# Patient Record
Sex: Female | Born: 1995 | Race: White | Hispanic: No | Marital: Single | Smoking: Never smoker
Health system: Southern US, Community
[De-identification: ages and names within clinical notes are randomized; demographics above are authoritative.]

## PROBLEM LIST (undated history)

## (undated) ENCOUNTER — Ambulatory Visit: Admission: EM | Payer: Self-pay | Source: Home / Self Care

## (undated) DIAGNOSIS — F329 Major depressive disorder, single episode, unspecified: Secondary | ICD-10-CM

## (undated) DIAGNOSIS — D649 Anemia, unspecified: Secondary | ICD-10-CM

## (undated) DIAGNOSIS — F32A Depression, unspecified: Secondary | ICD-10-CM

## (undated) HISTORY — DX: Depression, unspecified: F32.A

## (undated) HISTORY — DX: Anemia, unspecified: D64.9

## (undated) HISTORY — DX: Major depressive disorder, single episode, unspecified: F32.9

## (undated) HISTORY — PX: NO PAST SURGERIES: SHX2092

---
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
## 2015-02-22 ENCOUNTER — Emergency Department: Payer: Self-pay | Admitting: Emergency Medicine

## 2016-12-01 ENCOUNTER — Emergency Department
Admission: EM | Admit: 2016-12-01 | Discharge: 2016-12-01 | Disposition: A | Discharge status: Home / Self Care | Payer: Medicaid Other | Attending: Emergency Medicine | Admitting: Emergency Medicine

## 2016-12-01 ENCOUNTER — Encounter: Payer: Self-pay | Admitting: Emergency Medicine

## 2016-12-01 DIAGNOSIS — N898 Other specified noninflammatory disorders of vagina: Secondary | ICD-10-CM | POA: Insufficient documentation

## 2016-12-01 DIAGNOSIS — O200 Threatened abortion: Secondary | ICD-10-CM | POA: Diagnosis not present

## 2016-12-01 DIAGNOSIS — Z3A00 Weeks of gestation of pregnancy not specified: Secondary | ICD-10-CM | POA: Diagnosis not present

## 2016-12-01 DIAGNOSIS — N939 Abnormal uterine and vaginal bleeding, unspecified: Secondary | ICD-10-CM | POA: Diagnosis present

## 2016-12-01 LAB — RAPID HIV SCREEN (HIV 1/2 AB+AG)
HIV 1/2 Antibodies: NONREACTIVE
HIV-1 P24 Antigen - HIV24: NONREACTIVE

## 2016-12-01 LAB — WET PREP, GENITAL
Clue Cells Wet Prep HPF POC: NONE SEEN
Sperm: NONE SEEN
Trich, Wet Prep: NONE SEEN
Yeast Wet Prep HPF POC: NONE SEEN

## 2016-12-01 LAB — POCT PREGNANCY, URINE: PREG TEST UR: POSITIVE — AB

## 2016-12-01 LAB — CHLAMYDIA/NGC RT PCR (ARMC ONLY)
CHLAMYDIA TR: NOT DETECTED
N GONORRHOEAE: NOT DETECTED

## 2016-12-01 LAB — ABO/RH: ABO/RH(D): A POS

## 2016-12-01 LAB — HCG, QUANTITATIVE, PREGNANCY: hCG, Beta Chain, Quant, S: 48642 m[IU]/mL — ABNORMAL HIGH (ref <5)

## 2016-12-01 NOTE — ED Notes (Signed)
A/o, small amount bleeding, denies pain, cramping.

## 2016-12-01 NOTE — ED Provider Notes (Signed)
[facility] Emergency [facility] Provider Note    ____________________________________________   I have reviewed the triage vital signs and the nursing notes.   HISTORY  Chief Complaint Vaginal Bleeding and Abdominal Cramping   History limited by: Not Limited   HPI Meagan Butler is a 20 y.o. female who presents to the emergency department today because of concerns for vaginal bleeding. The patient states that she found out she was pregnant couple of weeks ago. Has not had any prenatal care since then. She does not recall having any period in November or October. She has had some nausea. The patient has not been taking prenatal vitamins.   History reviewed. No pertinent past medical history.  There are no active problems to display for this patient.   History reviewed. No pertinent surgical history.  Prior to Admission medications   Not on File    Allergies Patient has no known allergies.  No family history on file.  Social History Social History  Substance Use Topics  . Smoking status: Never Smoker  . Smokeless tobacco: Never Used  . Alcohol use No    Review of Systems  Constitutional: Negative for fever. Cardiovascular: Negative for chest pain. Respiratory: Negative for shortness of breath. Gastrointestinal: Negative for abdominal pain, vomiting and diarrhea. Genitourinary: Positive for vaginal bleeding. Musculoskeletal: Negative for back pain. Skin: Negative for rash. Neurological: Negative for headaches, focal weakness or numbness.  10-point ROS otherwise negative.  ____________________________________________   PHYSICAL EXAM:  VITAL SIGNS: ED Triage Vitals [12/01/16 1457]  Enc Vitals Group     BP 124/78     Pulse Rate 98     Resp 20     Temp 98.3 F (36.8 C)     Temp Source Oral     SpO2 100 %     Weight 78 lb (35.4 kg)    Constitutional: Alert and oriented. Well appearing and in no distress. Eyes: Conjunctivae  are normal. Normal extraocular movements. ENT   Head: Normocephalic and atraumatic.   Nose: No congestion/rhinnorhea.   Mouth/Throat: Mucous membranes are moist.   Neck: No stridor. Hematological/Lymphatic/Immunilogical: No cervical lymphadenopathy. Cardiovascular: Normal rate, regular rhythm.  No murmurs, rubs, or gallops.  Respiratory: Normal respiratory effort without tachypnea nor retractions. Breath sounds are clear and equal bilaterally. No wheezes/rales/rhonchi. Gastrointestinal: Soft and non tender. No rebound. No guarding.  Pelvic exam: No external lesions. No blood in vaginal vault. Small amount of discharge. No CMT. No adnexal fullness or tenderness.  Musculoskeletal: Normal range of motion in all extremities. No lower extremity edema. Neurologic:  Normal speech and language. No gross focal neurologic deficits are appreciated.  Skin:  Skin is warm, dry and intact. No rash noted. Psychiatric: Mood and affect are normal. Speech and behavior are normal. Patient exhibits appropriate insight and judgment.  ____________________________________________    LABS (pertinent positives/negatives)  Labs Reviewed  WET PREP, GENITAL - Abnormal; Notable for the following:       Result Value   WBC, Wet Prep HPF POC FEW (*)    All other components within normal limits  HCG, QUANTITATIVE, PREGNANCY - Abnormal; Notable for the following:    hCG, Beta Chain, Quant, [place] [id number] (*)    All other components within normal limits  POCT PREGNANCY, URINE - Abnormal; Notable for the following:    Preg Test, Ur POSITIVE (*)    All other components within normal limits  CHLAMYDIA/NGC RT PCR ([facility] ONLY)  RAPID HIV SCREEN (HIV 1/2 AB+AG)  POC  URINE PREG, ED  ABO/RH     ____________________________________________   EKG  None  ____________________________________________     RADIOLOGY  None  ____________________________________________   PROCEDURES  Procedures  ____________________________________________   INITIAL IMPRESSION / ASSESSMENT AND PLAN / ED COURSE a Pertinent labs & imaging results that were available during my care of the patient were reviewed by me and considered in my medical decision making (see chart for details).  Patient presented to the emergency department today because of concerns for vaginal spotting in the setting of an early pregnancy. Patient's Rh+. Pelvic exam without any concerning findings. Bedside ultrasound does show a single intrauterine pregnancy with heartbeat. Will have patient follow-up with OB/GYN.  ____________________________________________   FINAL CLINICAL IMPRESSION(S) / ED DIAGNOSES  Final diagnoses:  Threatened miscarriage     Note: This dictation was prepared with Dragon dictation. Any transcriptional errors that result from this process are unintentional     Phineas SemenGraydon Shona Pardo, MD 12/01/16 2034

## 2016-12-01 NOTE — Discharge Instructions (Signed)
Please start taking prenatal vitamins daily. Please seek medical attention for any high fevers, chest pain, shortness of breath, change in behavior, persistent vomiting, bloody stool or any other new or concerning symptoms.

## 2016-12-01 NOTE — ED Triage Notes (Signed)
Pt states she is pregnant and having spotting last night with some cramping. pts first pregnancy. Pt lmp back in October. Pregnancy confirmed by home preg test.

## 2016-12-11 ENCOUNTER — Ambulatory Visit (INDEPENDENT_AMBULATORY_CARE_PROVIDER_SITE_OTHER): Payer: Self-pay | Admitting: Certified Nurse Midwife

## 2016-12-11 VITALS — BP 111/78 | HR 105 | Ht 62.0 in | Wt 80.0 lb

## 2016-12-11 DIAGNOSIS — Z202 Contact with and (suspected) exposure to infections with a predominantly sexual mode of transmission: Secondary | ICD-10-CM

## 2016-12-11 DIAGNOSIS — O219 Vomiting of pregnancy, unspecified: Secondary | ICD-10-CM

## 2016-12-11 DIAGNOSIS — Z3687 Encounter for antenatal screening for uncertain dates: Secondary | ICD-10-CM

## 2016-12-11 DIAGNOSIS — Z3401 Encounter for supervision of normal first pregnancy, first trimester: Secondary | ICD-10-CM

## 2016-12-11 MED ORDER — CITRANATAL B-CALM 20-1 MG & 2 X 25 MG PO MISC: 25.0000 mg | Freq: Every day | ORAL | 3 refills | Status: DC

## 2016-12-11 NOTE — Progress Notes (Signed)
Meagan Kym GroomM Bargar presents for NOB nurse interview visit. Pregnancy confirmation done __[facility] on 12/18. Pos upt. Per ER notes bedside u/s showing SIUP with HRT tones.  Was seen for vag bleeding and cramps. NO bleeding since. Blood type-  A POS.  G-1 .  P0- . Uncertain lmp- 09/12/2016 EDD- 06/19/2017. 12 6/7.  Dating scan asap. Pt aware to see Marcelino DusterMichelle at 10-11 weeks depending on results of scan.  Pregnancy education material explained and given. __0_ cats in the home. NOB labs ordered.  Drug screen and HIV labs ordered. PNV encouraged. Pos n/v. Pt has taken b6 and unisom with relief. Advised to continue. If pt needs additional meds she is to contact office. Citra natal Bcalm erx.  Genetic screening to discuss with provider. Pt. To follow up with provider in _1_ weeks(depending on u/s results)  for NOB physical.  All questions answered.

## 2016-12-11 NOTE — Patient Instructions (Signed)
Minor Illnesses and Medications in Pregnancy  Cold/Flu:  Sudafed for congestion- Robitussin (plain) for cough- Tylenol for discomfort.  Please follow the directions on the label.  Try not to take any more than needed.  OTC Saline nasal spray and air humidifier or cool-mist  Vaporizer to sooth nasal irritation and to loosen congestion.  It is also important to increase intake of non carbonated fluids, especially if you have a fever.  Constipation:  Colace-2 capsules at bedtime; Metamucil- follow directions on label; Senokot- 1 tablet at bedtime.  Any one of these medications can be used.  It is also very important to increase fluids and fruits along with regular exercise.  If problem persists please call the office.  Diarrhea:  Kaopectate as directed on the label.  Eat a bland diet and increase fluids.  Avoid highly seasoned foods.  Headache:  Tylenol 1 or 2 tablets every 3-4 hours as needed  Indigestion:  Maalox, Mylanta, Tums or Rolaids- as directed on label.  Also try to eat small meals and avoid fatty, greasy or spicy foods.  Nausea with or without Vomiting:  Nausea in pregnancy is caused by increased levels of hormones in the body which influence the digestive system and cause irritation when stomach acids accumulate.  Symptoms usually subside after 1st trimester of pregnancy.  Try the following: 1. Keep saltines, graham crackers or dry toast by your bed to eat upon awakening. 2. Don't let your stomach get empty.  Try to eat 5-6 small meals per day instead of 3 large ones. 3. Avoid greasy fatty or highly seasoned foods.  4. Take OTC Unisom 1 tablet at bed time along with OTC Vitamin B6 25-50 mg 3 times per day.    If nausea continues with vomiting and you are unable to keep down food and fluids you may need a prescription medication.  Please notify your provider.   Sore throat:  Chloraseptic spray, throat lozenges and or plain Tylenol.  Vaginal Yeast Infection:  OTC Monistat for 7 days as  directed on label.  If symptoms do not resolve within a week notify provider.  If any of the above problems do not subside with recommended treatment please call the office for further assistance.   Do not take Aspirin, Advil, Motrin or Ibuprofen.  * * OTC= Over the counter How a Baby Grows During Pregnancy Introduction Pregnancy begins when a female's sperm enters a female's egg (fertilization). This happens in one of the tubes (fallopian tubes) that connect the ovaries to the womb (uterus). The fertilized egg is called an embryo until it reaches 10 weeks. From 10 weeks until birth, it is called a fetus. The fertilized egg moves down the fallopian tube to the uterus. Then it implants into the lining of the uterus and begins to grow. The developing fetus receives oxygen and nutrients through the pregnant woman's bloodstream and the tissues that grow (placenta) to support the fetus. The placenta is the life support system for the fetus. It provides nutrition and removes waste. Learning as much as you can about your pregnancy and how your baby is developing can help you enjoy the experience. It can also make you aware of when there might be a problem and when to ask questions. How long does a typical pregnancy last? A pregnancy usually lasts 280 days, or about 40 weeks. Pregnancy is divided into three trimesters:  First trimester: 0-13 weeks.  Second trimester: 14-27 weeks.  Third trimester: 28-40 weeks. The day when your baby   is considered ready to be born (full term) is your estimated date of delivery. How does my baby develop month by month? First month  The fertilized egg attaches to the inside of the uterus.  Some cells will form the placenta. Others will form the fetus.  The arms, legs, brain, spinal cord, lungs, and heart begin to develop.  At the end of the first month, the heart begins to beat. Second month  The bones, inner ear, eyelids, hands, and feet form.  The genitals  develop.  By the end of 8 weeks, all major organs are developing. Third month  All of the internal organs are forming.  Teeth develop below the gums.  Bones and muscles begin to grow. The spine can flex.  The skin is transparent.  Fingernails and toenails begin to form.  Arms and legs continue to grow longer, and hands and feet develop.  The fetus is about 3 in (7.6 cm) long. Fourth month  The placenta is completely formed.  The external sex organs, neck, outer ear, eyebrows, eyelids, and fingernails are formed.  The fetus can hear, swallow, and move its arms and legs.  The kidneys begin to produce urine.  The skin is covered with a white waxy coating (vernix) and very fine hair (lanugo). Fifth month  The fetus moves around more and can be felt for the first time (quickening).  The fetus starts to sleep and wake up and may begin to suck its finger.  The nails grow to the end of the fingers.  The organ in the digestive system that makes bile (gallbladder) functions and helps to digest the nutrients.  If your baby is a girl, eggs are present in her ovaries. If your baby is a boy, testicles start to move down into his scrotum. Sixth month  The lungs are formed, but the fetus is not yet able to breathe.  The eyes open. The brain continues to develop.  Your baby has fingerprints and toe prints. Your baby's hair grows thicker.  At the end of the second trimester, the fetus is about 9 in (22.9 cm) long. Seventh month  The fetus kicks and stretches.  The eyes are developed enough to sense changes in light.  The hands can make a grasping motion.  The fetus responds to sound. Eighth month  All organs and body systems are fully developed and functioning.  Bones harden and taste buds develop. The fetus may hiccup.  Certain areas of the brain are still developing. The skull remains soft. Ninth month  The fetus gains about  lb (0.23 kg) each week.  The lungs  are fully developed.  Patterns of sleep develop.  The fetus's head typically moves into a head-down position (vertex) in the uterus to prepare for birth. If the buttocks move into a vertex position instead, the baby is breech.  The fetus weighs 6-9 lbs (2.72-4.08 kg) and is 19-20 in (48.26-50.8 cm) long. What can I do to have a healthy pregnancy and help my baby develop?  Eating and Drinking  Eat a healthy diet.  Talk with your health care provider to make sure that you are getting the nutrients that you and your baby need.  Visit www.choosemyplate.gov to learn about creating a healthy diet.  Gain a healthy amount of weight during pregnancy as advised by your health care provider. This is usually 25-35 pounds. You may need to:  Gain more if you were underweight before getting pregnant or if you are pregnant   with more than one baby.  Gain less if you were overweight or obese when you got pregnant. Medicines and Vitamins  Take prenatal vitamins as directed by your health care provider. These include vitamins such as folic acid, iron, calcium, and vitamin D. They are important for healthy development.  Take medicines only as directed by your health care provider. Read labels and ask a pharmacist or your health care provider whether over-the-counter medicines, supplements, and prescription drugs are safe to take during pregnancy. Activities  Be physically active as advised by your health care provider. Ask your health care provider to recommend activities that are safe for you to do, such as walking or swimming.  Do not participate in strenuous or extreme sports.  Lifestyle  Do not drink alcohol.  Do not use any tobacco products, including cigarettes, chewing tobacco, or electronic cigarettes. If you need help quitting, ask your health care provider.  Do not use illegal drugs. Safety  Avoid exposure to mercury, lead, or other heavy metals. Ask your health care provider about  common sources of these heavy metals.  Avoid listeria infection during pregnancy. Follow these precautions:  Do not eat soft cheeses or deli meats.  Do not eat hot dogs unless they have been warmed up to the point of steaming, such as in the microwave oven.  Do not drink unpasteurized milk.  Avoid toxoplasmosis infection during pregnancy. Follow these precautions:  Do not change your cat's litter box, if you have a cat. Ask someone else to do this for you.  Wear gardening gloves while working in the yard. General Instructions  Keep all follow-up visits as directed by your health care provider. This is important. This includes prenatal care and screening tests.  Manage any chronic health conditions. Work closely with your health care provider to keep conditions, such as diabetes, under control. How do I know if my baby is developing well? At each prenatal visit, your health care provider will do several different tests to check on your health and keep track of your baby's development. These include:  Fundal height.  Your health care provider will measure your growing belly from top to bottom using a tape measure.  Your health care provider will also feel your belly to determine your baby's position.  Heartbeat.  An ultrasound in the first trimester can confirm pregnancy and show a heartbeat, depending on how far along you are.  Your health care provider will check your baby's heart rate at every prenatal visit.  As you get closer to your delivery date, you may have regular fetal heart rate monitoring to make sure that your baby is not in distress.  Second trimester ultrasound.  This ultrasound checks your baby's development. It also indicates your baby's gender. What should I do if I have concerns about my baby's development? Always talk with your health care provider about any concerns that you may have. This information is not intended to replace advice given to you by your  health care provider. Make sure you discuss any questions you have with your health care provider. Document Released: 05/19/2008 Document Revised: 05/08/2016 Document Reviewed: 05/10/2014  2017 [employer] First Trimester of Pregnancy The first trimester of pregnancy is from week 1 until the end of week 12 (months 1 through 3). A week after a sperm fertilizes an egg, the egg will implant on the wall of the uterus. This embryo will begin to develop into a baby. Genes from you and your partner are forming the   baby. The female genes determine whether the baby is a boy or a girl. At 6-8 weeks, the eyes and face are formed, and the heartbeat can be seen on ultrasound. At the end of 12 weeks, all the baby's organs are formed.  Now that you are pregnant, you will want to do everything you can to have a healthy baby. Two of the most important things are to get good prenatal care and to follow your health care provider's instructions. Prenatal care is all the medical care you receive before the baby's birth. This care will help prevent, find, and treat any problems during the pregnancy and childbirth. BODY CHANGES Your body goes through many changes during pregnancy. The changes vary from woman to woman.   You may gain or lose a couple of pounds at first.  You may feel sick to your stomach (nauseous) and throw up (vomit). If the vomiting is uncontrollable, call your health care provider.  You may tire easily.  You may develop headaches that can be relieved by medicines approved by your health care provider.  You may urinate more often. Painful urination may mean you have a bladder infection.  You may develop heartburn as a result of your pregnancy.  You may develop constipation because certain hormones are causing the muscles that push waste through your intestines to slow down.  You may develop hemorrhoids or swollen, bulging veins (varicose veins).  Your breasts may begin to grow larger and become  tender. Your nipples may stick out more, and the tissue that surrounds them (areola) may become darker.  Your gums may bleed and may be sensitive to brushing and flossing.  Dark spots or blotches (chloasma, mask of pregnancy) may develop on your face. This will likely fade after the baby is born.  Your menstrual periods will stop.  You may have a loss of appetite.  You may develop cravings for certain kinds of food.  You may have changes in your emotions from day to day, such as being excited to be pregnant or being concerned that something may go wrong with the pregnancy and baby.  You may have more vivid and strange dreams.  You may have changes in your hair. These can include thickening of your hair, rapid growth, and changes in texture. Some women also have hair loss during or after pregnancy, or hair that feels dry or thin. Your hair will most likely return to normal after your baby is born. WHAT TO EXPECT AT YOUR PRENATAL VISITS During a routine prenatal visit:  You will be weighed to make sure you and the baby are growing normally.  Your blood pressure will be taken.  Your abdomen will be measured to track your baby's growth.  The fetal heartbeat will be listened to starting around week 10 or 12 of your pregnancy.  Test results from any previous visits will be discussed. Your health care provider may ask you:  How you are feeling.  If you are feeling the baby move.  If you have had any abnormal symptoms, such as leaking fluid, bleeding, severe headaches, or abdominal cramping.  If you are using any tobacco products, including cigarettes, chewing tobacco, and electronic cigarettes.  If you have any questions. Other tests that may be performed during your first trimester include:  Blood tests to find your blood type and to check for the presence of any previous infections. They will also be used to check for low iron levels (anemia) and Rh antibodies. Later in   the  pregnancy, blood tests for diabetes will be done along with other tests if problems develop.  Urine tests to check for infections, diabetes, or protein in the urine.  An ultrasound to confirm the proper growth and development of the baby.  An amniocentesis to check for possible genetic problems.  Fetal screens for spina bifida and Down syndrome.  You may need other tests to make sure you and the baby are doing well.  HIV (human immunodeficiency virus) testing. Routine prenatal testing includes screening for HIV, unless you choose not to have this test. HOME CARE INSTRUCTIONS  Medicines   Follow your health care provider's instructions regarding medicine use. Specific medicines may be either safe or unsafe to take during pregnancy.  Take your prenatal vitamins as directed.  If you develop constipation, try taking a stool softener if your health care provider approves. Diet   Eat regular, well-balanced meals. Choose a variety of foods, such as meat or vegetable-based protein, fish, milk and low-fat dairy products, vegetables, fruits, and whole grain breads and cereals. Your health care provider will help you determine the amount of weight gain that is right for you.  Avoid raw meat and uncooked cheese. These carry germs that can cause birth defects in the baby.  Eating four or five small meals rather than three large meals a day may help relieve nausea and vomiting. If you start to feel nauseous, eating a few soda crackers can be helpful. Drinking liquids between meals instead of during meals also seems to help nausea and vomiting.  If you develop constipation, eat more high-fiber foods, such as fresh vegetables or fruit and whole grains. Drink enough fluids to keep your urine clear or pale yellow. Activity and Exercise   Exercise only as directed by your health care provider. Exercising will help you:  Control your weight.  Stay in shape.  Be prepared for labor and  delivery.  Experiencing pain or cramping in the lower abdomen or low back is a good sign that you should stop exercising. Check with your health care provider before continuing normal exercises.  Try to avoid standing for long periods of time. Move your legs often if you must stand in one place for a long time.  Avoid heavy lifting.  Wear low-heeled shoes, and practice good posture.  You may continue to have sex unless your health care provider directs you otherwise. Relief of Pain or Discomfort   Wear a good support bra for breast tenderness.   Take warm sitz baths to soothe any pain or discomfort caused by hemorrhoids. Use hemorrhoid cream if your health care provider approves.   Rest with your legs elevated if you have leg cramps or low back pain.  If you develop varicose veins in your legs, wear support hose. Elevate your feet for 15 minutes, 3-4 times a day. Limit salt in your diet. Prenatal Care   Schedule your prenatal visits by the twelfth week of pregnancy. They are usually scheduled monthly at first, then more often in the last 2 months before delivery.  Write down your questions. Take them to your prenatal visits.  Keep all your prenatal visits as directed by your health care provider. Safety   Wear your seat belt at all times when driving.  Make a list of emergency phone numbers, including numbers for family, friends, the hospital, and police and fire departments. General Tips   Ask your health care provider for a referral to a local prenatal education class. Begin   classes no later than at the beginning of month 6 of your pregnancy.  Ask for help if you have counseling or nutritional needs during pregnancy. Your health care provider can offer advice or refer you to specialists for help with various needs.  Do not use hot tubs, steam rooms, or saunas.  Do not douche or use tampons or scented sanitary pads.  Do not cross your legs for long periods of  time.  Avoid cat litter boxes and soil used by cats. These carry germs that can cause birth defects in the baby and possibly loss of the fetus by miscarriage or stillbirth.  Avoid all smoking, herbs, alcohol, and medicines not prescribed by your health care provider. Chemicals in these affect the formation and growth of the baby.  Do not use any tobacco products, including cigarettes, chewing tobacco, and electronic cigarettes. If you need help quitting, ask your health care provider. You may receive counseling support and other resources to help you quit.  Schedule a dentist appointment. At home, brush your teeth with a soft toothbrush and be gentle when you floss. SEEK MEDICAL CARE IF:   You have dizziness.  You have mild pelvic cramps, pelvic pressure, or nagging pain in the abdominal area.  You have persistent nausea, vomiting, or diarrhea.  You have a bad smelling vaginal discharge.  You have pain with urination.  You notice increased swelling in your face, hands, legs, or ankles. SEEK IMMEDIATE MEDICAL CARE IF:   You have a fever.  You are leaking fluid from your vagina.  You have spotting or bleeding from your vagina.  You have severe abdominal cramping or pain.  You have rapid weight gain or loss.  You vomit blood or material that looks like coffee grounds.  You are exposed to German measles and have never had them.  You are exposed to fifth disease or chickenpox.  You develop a severe headache.  You have shortness of breath.  You have any kind of trauma, such as from a fall or a car accident. This information is not intended to replace advice given to you by your health care provider. Make sure you discuss any questions you have with your health care provider. Document Released: 11/25/2001 Document Revised: 12/22/2014 Document Reviewed: 10/11/2013 Elsevier Interactive Patient Education  2017 [employer] Commonly Asked Questions During Pregnancy  Cats: A  parasite can be excreted in cat feces.  To avoid exposure you need to have another person empty the little box.  If you must empty the litter box you will need to wear gloves.  Wash your hands after handling your cat.  This parasite can also be found in raw or undercooked meat so this should also be avoided.  Colds, Sore Throats, Flu: Please check your medication sheet to see what you can take for symptoms.  If your symptoms are unrelieved by these medications please call the office.  Dental Work: Most any dental work your dentist recommends is permitted.  X-rays should only be taken during the first trimester if absolutely necessary.  Your abdomen should be shielded with a lead apron during all x-rays.  Please notify your provider prior to receiving any x-rays.  Novocaine is fine; gas is not recommended.  If your dentist requires a note from us prior to dental work please call the office and we will provide one for you.  Exercise: Exercise is an important part of staying healthy during your pregnancy.  You may continue most exercises you were accustomed   to prior to pregnancy.  Later in your pregnancy you will most likely notice you have difficulty with activities requiring balance like riding a bicycle.  It is important that you listen to your body and avoid activities that put you at a higher risk of falling.  Adequate rest and staying well hydrated are a must!  If you have questions about the safety of specific activities ask your provider.    Exposure to Children with illness: Try to avoid obvious exposure; report any symptoms to us when noted,  If you have chicken pos, red measles or mumps, you should be immune to these diseases.   Please do not take any vaccines while pregnant unless you have checked with your OB provider.  Fetal Movement: After 28 weeks we recommend you do "kick counts" twice daily.  Lie or sit down in a calm quiet environment and count your baby movements "kicks".  You should feel  your baby at least 10 times per hour.  If you have not felt 10 kicks within the first hour get up, walk around and have something sweet to eat or drink then repeat for an additional hour.  If count remains less than 10 per hour notify your provider.  Fumigating: Follow your pest control agent's advice as to how long to stay out of your home.  Ventilate the area well before re-entering.  Hemorrhoids:   Most over-the-counter preparations can be used during pregnancy.  Check your medication to see what is safe to use.  It is important to use a stool softener or fiber in your diet and to drink lots of liquids.  If hemorrhoids seem to be getting worse please call the office.   Hot Tubs:  Hot tubs Jacuzzis and saunas are not recommended while pregnant.  These increase your internal body temperature and should be avoided.  Intercourse:  Sexual intercourse is safe during pregnancy as long as you are comfortable, unless otherwise advised by your provider.  Spotting may occur after intercourse; report any bright red bleeding that is heavier than spotting.  Labor:  If you know that you are in labor, please go to the hospital.  If you are unsure, please call the office and let us help you decide what to do.  Lifting, straining, etc:  If your job requires heavy lifting or straining please check with your provider for any limitations.  Generally, you should not lift items heavier than that you can lift simply with your hands and arms (no back muscles)  Painting:  Paint fumes do not harm your pregnancy, but may make you ill and should be avoided if possible.  Latex or water based paints have less odor than oils.  Use adequate ventilation while painting.  Permanents & Hair Color:  Chemicals in hair dyes are not recommended as they cause increase hair dryness which can increase hair loss during pregnancy.  " Highlighting" and permanents are allowed.  Dye may be absorbed differently and permanents may not hold as well  during pregnancy.  Sunbathing:  Use a sunscreen, as skin burns easily during pregnancy.  Drink plenty of fluids; avoid over heating.  Tanning Beds:  Because their possible side effects are still unknown, tanning beds are not recommended.  Ultrasound Scans:  Routine ultrasounds are performed at approximately 20 weeks.  You will be able to see your baby's general anatomy an if you would like to know the gender this can usually be determined as well.  If it is questionable when   you conceived you may also receive an ultrasound early in your pregnancy for dating purposes.  Otherwise ultrasound exams are not routinely performed unless there is a medical necessity.  Although you can request a scan we ask that you pay for it when conducted because insurance does not cover " patient request" scans.  Work: If your pregnancy proceeds without complications you may work until your due date, unless your physician or employer advises otherwise.  Round Ligament Pain/Pelvic Discomfort:  Sharp, shooting pains not associated with bleeding are fairly common, usually occurring in the second trimester of pregnancy.  They tend to be worse when standing up or when you remain standing for long periods of time.  These are the result of pressure of certain pelvic ligaments called "round ligaments".  Rest, Tylenol and heat seem to be the most effective relief.  As the womb and fetus grow, they rise out of the pelvis and the discomfort improves.  Please notify the office if your pain seems different than that described.  It may represent a more serious condition.   

## 2016-12-12 ENCOUNTER — Ambulatory Visit (INDEPENDENT_AMBULATORY_CARE_PROVIDER_SITE_OTHER): Payer: Self-pay

## 2016-12-12 DIAGNOSIS — Z3401 Encounter for supervision of normal first pregnancy, first trimester: Secondary | ICD-10-CM

## 2016-12-12 DIAGNOSIS — Z3687 Encounter for antenatal screening for uncertain dates: Secondary | ICD-10-CM

## 2016-12-12 LAB — CBC WITH DIFFERENTIAL/PLATELET
BASOS: 0 %
Basophils Absolute: 0 10*3/uL (ref 0.0–0.2)
EOS (ABSOLUTE): 0 10*3/uL (ref 0.0–0.4)
EOS: 0 %
HEMATOCRIT: 35.6 % (ref 34.0–46.6)
Hemoglobin: 12.2 g/dL (ref 11.1–15.9)
IMMATURE GRANS (ABS): 0 10*3/uL (ref 0.0–0.1)
IMMATURE GRANULOCYTES: 0 %
Lymphocytes Absolute: 1.3 10*3/uL (ref 0.7–3.1)
Lymphs: 16 %
MCH: 30.3 pg (ref 26.6–33.0)
MCHC: 34.3 g/dL (ref 31.5–35.7)
MCV: 88 fL (ref 79–97)
MONOS ABS: 1 10*3/uL — AB (ref 0.1–0.9)
Monocytes: 13 %
NEUTROS ABS: 5.7 10*3/uL (ref 1.4–7.0)
NEUTROS PCT: 71 %
Platelets: 296 10*3/uL (ref 150–379)
RBC: 4.03 x10E6/uL (ref 3.77–5.28)
RDW: 14.7 % (ref 12.3–15.4)
WBC: 8.2 10*3/uL (ref 3.4–10.8)

## 2016-12-12 LAB — RUBELLA SCREEN: Rubella Antibodies, IGG: 2.48 index (ref >0.99)

## 2016-12-12 LAB — ABO AND RH: RH TYPE: POSITIVE

## 2016-12-12 LAB — VARICELLA ZOSTER ANTIBODY, IGG: VARICELLA: 312 {index} (ref >165)

## 2016-12-12 LAB — HIV ANTIBODY (ROUTINE TESTING W REFLEX): HIV Screen 4th Generation wRfx: NONREACTIVE

## 2016-12-12 LAB — ANTIBODY SCREEN: Antibody Screen: NEGATIVE

## 2016-12-12 LAB — RPR: RPR Ser Ql: NONREACTIVE

## 2016-12-12 LAB — HEPATITIS B SURFACE ANTIGEN: Hepatitis B Surface Ag: NEGATIVE

## 2016-12-13 LAB — GC/CHLAMYDIA PROBE AMP
CHLAMYDIA, DNA PROBE: NEGATIVE
NEISSERIA GONORRHOEAE BY PCR: NEGATIVE

## 2016-12-13 LAB — CULTURE, OB URINE

## 2016-12-13 LAB — URINE CULTURE, OB REFLEX

## 2016-12-15 NOTE — L&D Delivery Note (Signed)
Delivery Note At  1923 a viable and healthy Female "Meagan Butler" was delivered via  (Presentation:OA ;  ).  APGAR:8 ,9  .   Placenta status: delivered intact with 3 vessel Cord:  with the following complications: none  Anesthesia:  episural Episiotomy:  none Lacerations:  2nd Suture Repair: 2.0 vicryl rapide Est. Blood Loss (mL):  150  Mom to postpartum.  Baby to Couplet care / Skin to Skin.  Devyon Keator N Kie Calvin 07/30/2017, 7:43 PM

## 2016-12-16 ENCOUNTER — Encounter: Payer: Medicaid Other | Admitting: Certified Nurse Midwife

## 2016-12-16 ENCOUNTER — Emergency Department
Admission: EM | Admit: 2016-12-16 | Discharge: 2016-12-16 | Disposition: A | Discharge status: Home / Self Care | Payer: Medicaid Other | Attending: Emergency Medicine | Admitting: Emergency Medicine

## 2016-12-16 ENCOUNTER — Encounter: Payer: Self-pay | Admitting: Medical Oncology

## 2016-12-16 DIAGNOSIS — O9989 Other specified diseases and conditions complicating pregnancy, childbirth and the puerperium: Secondary | ICD-10-CM | POA: Diagnosis not present

## 2016-12-16 DIAGNOSIS — O26891 Other specified pregnancy related conditions, first trimester: Secondary | ICD-10-CM | POA: Diagnosis present

## 2016-12-16 DIAGNOSIS — M436 Torticollis: Secondary | ICD-10-CM | POA: Insufficient documentation

## 2016-12-16 DIAGNOSIS — Z79899 Other long term (current) drug therapy: Secondary | ICD-10-CM | POA: Insufficient documentation

## 2016-12-16 DIAGNOSIS — Z3A08 8 weeks gestation of pregnancy: Secondary | ICD-10-CM | POA: Diagnosis not present

## 2016-12-16 LAB — MONITOR DRUG PROFILE 14(MW)
AMPHETAMINE SCREEN URINE: NEGATIVE ng/mL
BARBITURATE SCREEN URINE: NEGATIVE ng/mL
BENZODIAZEPINE SCREEN, URINE: NEGATIVE ng/mL
BUPRENORPHINE, URINE: NEGATIVE ng/mL
Cocaine (Metab) Scrn, Ur: NEGATIVE ng/mL
Creatinine(Crt), U: 124.4 mg/dL (ref 20.0–300.0)
FENTANYL, URINE: NEGATIVE pg/mL
Meperidine Screen, Urine: NEGATIVE ng/mL
Methadone Screen, Urine: NEGATIVE ng/mL
OPIATE SCREEN URINE: NEGATIVE ng/mL
OXYCODONE+OXYMORPHONE UR QL SCN: NEGATIVE ng/mL
PHENCYCLIDINE QUANTITATIVE URINE: NEGATIVE ng/mL
PROPOXYPHENE SCREEN URINE: NEGATIVE ng/mL
Ph of Urine: 5.5 (ref 4.5–8.9)
SPECIFIC GRAVITY: 1.019
TRAMADOL SCREEN, URINE: NEGATIVE ng/mL

## 2016-12-16 LAB — URINALYSIS, ROUTINE W REFLEX MICROSCOPIC
Bilirubin, UA: NEGATIVE
Glucose, UA: NEGATIVE
KETONES UA: NEGATIVE
LEUKOCYTES UA: NEGATIVE
NITRITE UA: NEGATIVE
Protein, UA: NEGATIVE
RBC UA: NEGATIVE
SPEC GRAV UA: 1.02 (ref 1.005–1.030)
UUROB: 0.2 mg/dL (ref 0.2–1.0)
pH, UA: 5.5 (ref 5.0–7.5)

## 2016-12-16 LAB — NICOTINE SCREEN, URINE: Cotinine Ql Scrn, Ur: NEGATIVE ng/mL

## 2016-12-16 LAB — CANNABINOID (GC/MS), URINE
CARBOXY THC UR: 26 ng/mL
Cannabinoid: POSITIVE — AB

## 2016-12-16 MED ORDER — OXYCODONE-ACETAMINOPHEN 5-325 MG PO TABS
1.0000 | ORAL_TABLET | Freq: Once | ORAL | Status: AC
  Administered 2016-12-16: 1 via ORAL
  Filled 2016-12-16: qty 1

## 2016-12-16 MED ORDER — OXYCODONE-ACETAMINOPHEN 5-325 MG PO TABS: 1.0000 | ORAL_TABLET | Freq: Four times a day (QID) | ORAL | 0 refills | Status: DC | PRN

## 2016-12-16 NOTE — ED Provider Notes (Signed)
[facility] Emergency Department Provider Note   ____________________________________________   First MD Initiated Contact with Patient 12/16/16 1303     (approximate)  I have reviewed the triage vital signs and the nursing notes.   HISTORY  Chief Complaint Neck Pain   HPI Meagan Butler is a 21 y.o. female Patient awakened this morning with right-sided neck pain. Patient state pain increases with any movement of the neck. Patient denies any radicular component to this pain patient denies loss sensation or function to the upper extremities. Patient is [redacted] weeks gestation and had a first OB appointment today. No palliative measures taken for her complaint.    Past Medical History:  Diagnosis Date  . Anemia   . Depression     There are no active problems to display for this patient.   Past Surgical History:  Procedure Laterality Date  . NO PAST SURGERIES      Prior to Admission medications   Medication Sig Start Date End Date Taking? Authorizing Provider  oxyCODONE-acetaminophen (ROXICET) 5-325 MG tablet Take 1 tablet by mouth every 6 (six) hours as needed for moderate pain. 12/16/16   Joni Reiningonald K Arvine Clayburn, PA-C  Prenat w/o A FeCbnFeGlu-FA &B6 (CITRANATAL B-CALM) 20-1 & 25 (2) MG MISC Take 25 mg by mouth daily. 12/11/16   Gunnar BullaJenkins Michelle Lawhorn, CNM  Prenatal Vit-Fe Fumarate-FA (MULTIVITAMIN-PRENATAL) 27-0.8 MG TABS tablet Take 1 tablet by mouth daily at 12 noon.    Historical Provider, MD    Allergies Patient has no known allergies.  Family History  Problem Relation Age of Onset  . Adopted: Yes    Social History Social History  Substance Use Topics  . Smoking status: Never Smoker  . Smokeless tobacco: Never Used  . Alcohol use No    Review of Systems Constitutional: No fever/chills Eyes: No visual changes. ENT: No sore throat. Cardiovascular: Denies chest pain. Respiratory: Denies shortness of breath. Gastrointestinal: No abdominal  pain.  No nausea, no vomiting.  No diarrhea.  No constipation. Genitourinary: Negative for dysuria. Musculoskeletal: Right lateral neck pain Skin: Negative for rash. Neurological: Negative for headaches, focal weakness or numbness. Psychiatric:Depression Hematological/Lymphatic:Anemia  ____________________________________________   PHYSICAL EXAM:  VITAL SIGNS: ED Triage Vitals  Enc Vitals Group     BP 12/16/16 1203 126/72     Pulse Rate 12/16/16 1203 100     Resp 12/16/16 1203 18     Temp 12/16/16 1203 99.2 F (37.3 C)     Temp Source 12/16/16 1203 Oral     SpO2 12/16/16 1203 99 %     Weight 12/16/16 1203 80 lb (36.3 kg)     Height 12/16/16 1203 5\' 2"  (1.575 m)     Head Circumference --      Peak Flow --      Pain Score 12/16/16 1217 3     Pain Loc --      Pain Edu? --      Excl. in GC? --     Constitutional: Alert and oriented. Well appearing and in no acute distress. Patient sitting in bed eating and drinking. Eyes: Conjunctivae are normal. PERRL. EOMI. Head: Atraumatic. Nose: No congestion/rhinnorhea. Mouth/Throat: Mucous membranes are moist.  Oropharynx non-erythematous. Neck: No stridor.  No cervical spine tenderness to palpation.Decreased range of motion all fields secondary to complain of pain. Hematological/Lymphatic/Immunilogical: No cervical lymphadenopathy. Cardiovascular: Normal rate, regular rhythm. Grossly normal heart sounds.  Good peripheral circulation. Respiratory: Normal respiratory effort.  No retractions. Lungs CTAB. Gastrointestinal: Soft  and nontender. No distention. No abdominal bruits. No CVA tenderness. Musculoskeletal: No lower extremity tenderness nor edema.  No joint effusions. Neurologic:  Normal speech and language. No gross focal neurologic deficits are appreciated. No gait instability. Skin:  Skin is warm, dry and intact. No rash noted. Psychiatric: Mood and affect are normal. Speech and behavior are  normal.  ____________________________________________   LABS (all labs ordered are listed, but only abnormal results are displayed)  Labs Reviewed - No data to display ____________________________________________  EKG   ____________________________________________  RADIOLOGY   ____________________________________________   PROCEDURES  Procedure(s) performed: None  Procedures  Critical Care performed: No  ____________________________________________   INITIAL IMPRESSION / ASSESSMENT AND PLAN / ED COURSE  Pertinent labs & imaging results that were available during my care of the patient were reviewed by me and considered in my medical decision making (see chart for details).  Torticollis. Patient given discharge care instructions. Patient advised warm compresses to the area 5-6 times daily. Patient given a prescription for Percocet.  Clinical Course      ____________________________________________   FINAL CLINICAL IMPRESSION(S) / ED DIAGNOSES  Final diagnoses:  Torticollis, acute      NEW MEDICATIONS STARTED DURING THIS VISIT:  New Prescriptions   OXYCODONE-ACETAMINOPHEN (ROXICET) 5-325 MG TABLET    Take 1 tablet by mouth every 6 (six) hours as needed for moderate pain.     Note:  This document was prepared using Dragon voice recognition software and may include unintentional dictation errors.    Joni Reining, PA-C 12/16/16 1314    Emily Filbert, MD 12/16/16 [phone]

## 2016-12-16 NOTE — ED Triage Notes (Signed)
Pt reports she went to bed last night feeling okay, last night she woke up during the middle of the night with the right side of her neck hurting, pain only with movement. Pt denies injury, denies other sx's.

## 2016-12-16 NOTE — Discharge Instructions (Signed)
Apply warm compresses to the area for 10 minutes, 5/6 times a day.

## 2016-12-16 NOTE — ED Notes (Addendum)
Pt reports having neck pain since 11pm last night - she reports that she awoke from sleep with the pain - denies headache/sore throat/fevers - pt states she is unable to move neck in any direction without severe pain - pt is [redacted] weeks pregnant and she had first appt last week and had appt today (ultrasound was done at the OB/GYN)

## 2016-12-30 ENCOUNTER — Other Ambulatory Visit: Payer: Medicaid Other

## 2016-12-30 DIAGNOSIS — Z3492 Encounter for supervision of normal pregnancy, unspecified, second trimester: Secondary | ICD-10-CM

## 2017-01-09 ENCOUNTER — Other Ambulatory Visit: Payer: Medicaid Other

## 2017-01-09 ENCOUNTER — Telehealth: Payer: Self-pay

## 2017-01-09 ENCOUNTER — Ambulatory Visit (INDEPENDENT_AMBULATORY_CARE_PROVIDER_SITE_OTHER): Payer: Self-pay | Admitting: Certified Nurse Midwife

## 2017-01-09 VITALS — BP 104/71 | HR 83 | Wt 80.6 lb

## 2017-01-09 DIAGNOSIS — Z3401 Encounter for supervision of normal first pregnancy, first trimester: Secondary | ICD-10-CM

## 2017-01-09 DIAGNOSIS — Z681 Body mass index (BMI) 19 or less, adult: Secondary | ICD-10-CM

## 2017-01-09 LAB — POCT URINALYSIS DIPSTICK
BILIRUBIN UA: NEGATIVE
Blood, UA: NEGATIVE
GLUCOSE UA: NEGATIVE
KETONES UA: NEGATIVE
LEUKOCYTES UA: NEGATIVE
NITRITE UA: NEGATIVE
Protein, UA: NEGATIVE
Spec Grav, UA: 1.015
Urobilinogen, UA: NEGATIVE
pH, UA: 8

## 2017-01-09 NOTE — Patient Instructions (Signed)
Second Trimester of Pregnancy The second trimester is from week 13 through week 28, month 4 through 6. This is often the time in pregnancy that you feel your best. Often times, morning sickness has lessened or quit. You may have more energy, and you may get hungry more often. Your unborn baby (fetus) is growing rapidly. At the end of the sixth month, he or she is about 9 inches long and weighs about 1 pounds. You will likely feel the baby move (quickening) between 18 and 20 weeks of pregnancy. Follow these instructions at home:  Avoid all smoking, herbs, and alcohol. Avoid drugs not approved by your doctor.  Do not use any tobacco products, including cigarettes, chewing tobacco, and electronic cigarettes. If you need help quitting, ask your doctor. You may get counseling or other support to help you quit.  Only take medicine as told by your doctor. Some medicines are safe and some are not during pregnancy.  Exercise only as told by your doctor. Stop exercising if you start having cramps.  Eat regular, healthy meals.  Wear a good support bra if your breasts are tender.  Do not use hot tubs, steam rooms, or saunas.  Wear your seat belt when driving.  Avoid raw meat, uncooked cheese, and liter boxes and soil used by cats.  Take your prenatal vitamins.  Take 1500-2000 milligrams of calcium daily starting at the 20th week of pregnancy until you deliver your baby.  Try taking medicine that helps you poop (stool softener) as needed, and if your doctor approves. Eat more fiber by eating fresh fruit, vegetables, and whole grains. Drink enough fluids to keep your pee (urine) clear or pale yellow.  Take warm water baths (sitz baths) to soothe pain or discomfort caused by hemorrhoids. Use hemorrhoid cream if your doctor approves.  If you have puffy, bulging veins (varicose veins), wear support hose. Raise (elevate) your feet for 15 minutes, 3-4 times a day. Limit salt in your diet.  Avoid heavy  lifting, wear low heals, and sit up straight.  Rest with your legs raised if you have leg cramps or low back pain.  Visit your dentist if you have not gone during your pregnancy. Use a soft toothbrush to brush your teeth. Be gentle when you floss.  You can have sex (intercourse) unless your doctor tells you not to.  Go to your doctor visits. Get help if:  You feel dizzy.  You have mild cramps or pressure in your lower belly (abdomen).  You have a nagging pain in your belly area.  You continue to feel sick to your stomach (nauseous), throw up (vomit), or have watery poop (diarrhea).  You have bad smelling fluid coming from your vagina.  You have pain with peeing (urination). Get help right away if:  You have a fever.  You are leaking fluid from your vagina.  You have spotting or bleeding from your vagina.  You have severe belly cramping or pain.  You lose or gain weight rapidly.  You have trouble catching your breath and have chest pain.  You notice sudden or extreme puffiness (swelling) of your face, hands, ankles, feet, or legs.  You have not felt the baby move in over an hour.  You have severe headaches that do not go away with medicine.  You have vision changes. This information is not intended to replace advice given to you by your health care provider. Make sure you discuss any questions you have with your health care   provider. Document Released: 02/25/2010 Document Revised: 05/08/2016 Document Reviewed: 02/01/2013 Elsevier Interactive Patient Education  2017 Elsevier Inc.  

## 2017-01-09 NOTE — Telephone Encounter (Signed)
Called [facility] lab spoke with Vikki PortsValerie, asked that the gender be added to this pt's chart. Vikki PortsValerie states that it would be added. She states that it will be a 24-72 hour turn around. Gave verbal ok.

## 2017-01-09 NOTE — Progress Notes (Signed)
uou

## 2017-01-09 NOTE — Progress Notes (Signed)
NEW OB HISTORY AND PHYSICAL  SUBJECTIVE:       Meagan Butler is a 21 y.o. G1P0 female, Patient's last menstrual period was 09/12/2016 (approximate)., Estimated Date of Delivery: 07/24/17, [id number], presents today for establishment of Prenatal Care.  She complains of nausea without vomiting mostly in the evenings that is getting better.   History significant for chest pain work up a few years ago and nutritional support with boost in the past.   Pt reports that she is "unable to gain weight", but does not have a fear of gaining weight with pregnancy.   Denies difficulty breathing or respiratory distress, chest pain, abdominal pain, vaginal bleeding, and leg pain or swelling.    Gynecologic History  Patient's last menstrual period was 09/12/2016 (approximate).   Obstetric History OB History  Gravida Para Term Preterm AB Living  1            SAB TAB Ectopic Multiple Live Births               # Outcome Date GA Lbr Len/2nd Weight Sex Delivery Anes PTL Lv  1 Current               Past Medical History:  Diagnosis Date  . Anemia   . Depression     Past Surgical History:  Procedure Laterality Date  . NO PAST SURGERIES      Current Outpatient Prescriptions on File Prior to Visit  Medication Sig Dispense Refill  . Prenat w/o A FeCbnFeGlu-FA &B6 (CITRANATAL B-CALM) 20-1 & 25 (2) MG MISC Take 25 mg by mouth daily. 90 each 3   No current facility-administered medications on file prior to visit.     No Known Allergies  Social History   Social History  . Marital status: Single    Spouse name: N/A  . Number of children: N/A  . Years of education: N/A   Occupational History  . Not on file.   Social History Main Topics  . Smoking status: Never Smoker  . Smokeless tobacco: Never Used  . Alcohol use No  . Drug use: No  . Sexual activity: Yes    Birth control/ protection: None   Other Topics Concern  . Not on file   Social History Narrative  . No narrative on file     Family History  Problem Relation Age of Onset  . Adopted: Yes    The following portions of the patient's history were reviewed and updated as appropriate: allergies, current medications, past OB history, past medical history, past surgical history, past family history, past social history, and problem list.    OBJECTIVE: Initial Physical Exam (New OB)  GENERAL APPEARANCE: alert, well appearing, in no apparent distress  HEAD: normocephalic, atraumatic  MOUTH: mucous membranes moist, pharynx normal without lesions  THYROID: no thyromegaly or masses present  BREASTS: no masses noted, no significant tenderness, no palpable axillary nodes, no skin changes  LUNGS: clear to auscultation, no wheezes, rales or rhonchi, symmetric air entry  HEART: regular rate and rhythm, no murmurs  ABDOMEN: soft, nontender, nondistended, no abnormal masses, no epigastric pain, fundus soft, nontender 12 weeks size and FHT present  EXTREMITIES: no redness or tenderness in the calves or thighs, no edema  SKIN: normal coloration and turgor, no rashes  LYMPH NODES: no adenopathy palpable  NEUROLOGIC: alert, oriented, normal speech, no focal findings or movement disorder noted  PELVIC EXAM EXTERNAL GENITALIA: normal appearing vulva with no masses, tenderness or lesions UTERUS: gravid  and consistent with 12 weeks ADNEXA: no masses palpable and nontender  ASSESSMENT: Normal pregnancy BMI less than 19  PLAN: Prenatal care MaterniT completed, awaiting gender results Declines flu vaccine, risks and benefits explained Healthy eating and expected weight gain reviewed [facility] paper completed RTC x 4 weeks or sooner if needed See orders   Gunnar Bulla, CNM

## 2017-01-10 LAB — MATERNIT21 PLUS CORE NO GENDER
CHROMOSOME 13: NEGATIVE
Chromosome 18: NEGATIVE
Chromosome 21: NEGATIVE

## 2017-01-12 ENCOUNTER — Telehealth: Payer: Self-pay | Admitting: Certified Nurse Midwife

## 2017-01-12 NOTE — Telephone Encounter (Signed)
Attempted to call patient x 2 on each number given. Numbers have been changed.   Sealed MaterniT 21 Plus results at the front desk.    Gunnar BullaJenkins Michelle Sanay Belmar, CNM

## 2017-01-20 ENCOUNTER — Emergency Department
Admission: EM | Admit: 2017-01-20 | Discharge: 2017-01-20 | Disposition: A | Discharge status: Home / Self Care | Payer: Medicaid Other | Attending: Emergency Medicine | Admitting: Emergency Medicine

## 2017-01-20 ENCOUNTER — Encounter: Payer: Self-pay | Admitting: Emergency Medicine

## 2017-01-20 DIAGNOSIS — J029 Acute pharyngitis, unspecified: Secondary | ICD-10-CM | POA: Diagnosis not present

## 2017-01-20 DIAGNOSIS — O26891 Other specified pregnancy related conditions, first trimester: Secondary | ICD-10-CM | POA: Insufficient documentation

## 2017-01-20 DIAGNOSIS — J111 Influenza due to unidentified influenza virus with other respiratory manifestations: Secondary | ICD-10-CM

## 2017-01-20 DIAGNOSIS — Z3A13 13 weeks gestation of pregnancy: Secondary | ICD-10-CM | POA: Diagnosis not present

## 2017-01-20 DIAGNOSIS — R05 Cough: Secondary | ICD-10-CM | POA: Diagnosis not present

## 2017-01-20 DIAGNOSIS — R69 Illness, unspecified: Secondary | ICD-10-CM

## 2017-01-20 DIAGNOSIS — R51 Headache: Secondary | ICD-10-CM | POA: Insufficient documentation

## 2017-01-20 LAB — INFLUENZA PANEL BY PCR (TYPE A & B)
Influenza A By PCR: NEGATIVE
Influenza B By PCR: NEGATIVE

## 2017-01-20 MED ORDER — OSELTAMIVIR PHOSPHATE 75 MG PO CAPS: 75.0000 mg | ORAL_CAPSULE | Freq: Two times a day (BID) | ORAL | 0 refills | Status: AC

## 2017-01-20 NOTE — ED Triage Notes (Signed)
Pt. States flu exposure. Cough, sore throat, HA x2 days. Reports Tylenol PM x2days, no relief

## 2017-01-20 NOTE — ED Provider Notes (Signed)
[facility] Emergency Department Provider Note ____________________________________________  Time seen: 2114  I have reviewed the triage vital signs and the nursing notes.  HISTORY  Chief Complaint  Sore Throat; Generalized Body Aches; and Cough  HPI Meagan Butler is a 21 y.o. female presents to the ED for evaluation of flulike symptoms. The patient is [redacted] weeks pregnant, and reports she did not receive seasonal flu vaccine.She reports a nonoperative cough, sore throat, headache the last 2 days. She denies any frank fevers, chills, sweats. She has dosed Tylenol PM for the last 2 days with no significant or lasting relief. She denies any urgency related complaints including vaginal bleeding, pelvic pain, or hyperemesis. She is concerned for possible flu infection.  Past Medical History:  Diagnosis Date  . Anemia   . Depression     There are no active problems to display for this patient.   Past Surgical History:  Procedure Laterality Date  . NO PAST SURGERIES      Prior to Admission medications   Medication Sig Start Date End Date Taking? Authorizing Provider  oseltamivir (TAMIFLU) 75 MG capsule Take 1 capsule (75 mg total) by mouth 2 (two) times daily. 01/20/17 01/25/17  Kynadi Dragos V Bacon Jarquis Walker, PA-C  Prenat w/o A FeCbnFeGlu-FA &B6 (CITRANATAL B-CALM) 20-1 & 25 (2) MG MISC Take 25 mg by mouth daily. 12/11/16   Gunnar BullaJenkins Michelle Lawhorn, CNM    Allergies Patient has no known allergies.  Family History  Problem Relation Age of Onset  . Adopted: Yes    Social History Social History  Substance Use Topics  . Smoking status: Never Smoker  . Smokeless tobacco: Never Used  . Alcohol use No    Review of Systems  Constitutional: Negative for fever. Eyes: Negative for visual changes. ENT: Positive for sore throat. Cardiovascular: Negative for chest pain. Respiratory: Negative for shortness of breath.For his cough as above. Gastrointestinal: Negative  for abdominal pain, vomiting and diarrhea. Genitourinary: Negative for dysuria. Musculoskeletal: Negative for back pain. Reports generalized bodyaches. Skin: Negative for rash. ____________________________________________  PHYSICAL EXAM:  VITAL SIGNS: ED Triage Vitals  Enc Vitals Group     BP 01/20/17 2047 132/88     Pulse Rate 01/20/17 2047 98     Resp 01/20/17 2047 (!) 21     Temp 01/20/17 2047 98.8 F (37.1 C)     Temp Source 01/20/17 2047 Oral     SpO2 01/20/17 2047 100 %     Weight 01/20/17 2049 81 lb (36.7 kg)     Height 01/20/17 2049 5\' 2"  (1.575 m)     Head Circumference --      Peak Flow --      Pain Score 01/20/17 2050 3     Pain Loc --      Pain Edu? --      Excl. in GC? --    Constitutional: Alert and oriented. Well appearing and in no distress. Head: Normocephalic and atraumatic. Eyes: Conjunctivae are normal. PERRL. Normal extraocular movements Ears: Canals clear. TMs intact bilaterally. Nose: No congestion/rhinorrhea/epistaxis. Mouth/Throat: Mucous membranes are moist.Uvula is midline and tonsils are flat. No oropharyngeal erythema or exudates noted. Neck: Supple. No thyromegaly. Hematological/Lymphatic/Immunological: No cervical lymphadenopathy. Cardiovascular: Normal rate, regular rhythm. Normal distal pulses. Respiratory: Normal respiratory effort. No wheezes/rales/rhonchi. Gastrointestinal: Soft and nontender. No distention. Musculoskeletal: Nontender with normal range of motion in all extremities.  ____________________________________________   LABS (pertinent positives/negatives) Labs Reviewed  INFLUENZA PANEL BY PCR (TYPE A & B)  ____________________________________________  INITIAL IMPRESSION / ASSESSMENT AND PLAN / ED COURSE  Female patient at [redacted] weeks gestation with presentation of flulike symptoms. She is discharged at this time with a list of medications that are stated to dose and pregnancy, over-the-counter. She is also given a prescription  for Tamiflu which she is advised she may take based on her flu results. She will call the ED and one hour to verify her flu test results. She is discharged at this time with a work note for one day, and instruction on treating her URI symptoms. ____________________________________________  FINAL CLINICAL IMPRESSION(S) / ED DIAGNOSES  Final diagnoses:  Influenza-like illness      Lissa Hoard, PA-C 01/20/17 2220    Phineas Semen, MD 01/20/17 2314

## 2017-01-20 NOTE — Discharge Instructions (Signed)
Your symptoms may represent influenza. You should continue to monitor and treat upper respiratory symptoms. You may call back in 1 hour for the results of your flu test. You may start the Tamiflu if you decide. You may safely take the following medicines during pregnancy:  - Tylenol   - Delsym (dextromethorphan)  - Flonase, Nasonex  - Claritin, Zyrtec, Allegra  - Benadryl (diphenhydramine)   Avoid Decongestants (pseudoephedrine, phenylephrine) and Anti-inflammatories (Ibuprofen, Motrin, Advil, Naproxen)

## 2017-02-06 ENCOUNTER — Ambulatory Visit (INDEPENDENT_AMBULATORY_CARE_PROVIDER_SITE_OTHER): Payer: Self-pay | Admitting: Certified Nurse Midwife

## 2017-02-06 ENCOUNTER — Encounter: Payer: Self-pay | Admitting: Certified Nurse Midwife

## 2017-02-06 VITALS — BP 117/74 | HR 80 | Wt 85.4 lb

## 2017-02-06 DIAGNOSIS — Z3492 Encounter for supervision of normal pregnancy, unspecified, second trimester: Secondary | ICD-10-CM

## 2017-02-06 DIAGNOSIS — Z3401 Encounter for supervision of normal first pregnancy, first trimester: Secondary | ICD-10-CM

## 2017-02-06 LAB — POCT URINALYSIS DIPSTICK
Bilirubin, UA: NEGATIVE
Blood, UA: NEGATIVE
Glucose, UA: NEGATIVE
Ketones, UA: NEGATIVE
LEUKOCYTES UA: NEGATIVE
NITRITE UA: NEGATIVE
PH UA: 6.5
PROTEIN UA: NEGATIVE
Spec Grav, UA: <=1.005
Urobilinogen, UA: NEGATIVE

## 2017-02-06 NOTE — Patient Instructions (Signed)
Minor Illnesses and Medications in Pregnancy  Cold/Flu:  Sudafed for congestion- Robitussin (plain) for cough- Tylenol for discomfort.  Please follow the directions on the label.  Try not to take any more than needed.  OTC Saline nasal spray and air humidifier or cool-mist  Vaporizer to sooth nasal irritation and to loosen congestion.  It is also important to increase intake of non carbonated fluids, especially if you have a fever.  Constipation:  Colace-2 capsules at bedtime; Metamucil- follow directions on label; Senokot- 1 tablet at bedtime.  Any one of these medications can be used.  It is also very important to increase fluids and fruits along with regular exercise.  If problem persists please call the office.  Diarrhea:  Kaopectate as directed on the label.  Eat a bland diet and increase fluids.  Avoid highly seasoned foods.  Headache:  Tylenol 1 or 2 tablets every 3-4 hours as needed  Indigestion:  Maalox, Mylanta, Tums or Rolaids- as directed on label.  Also try to eat small meals and avoid fatty, greasy or spicy foods.  Nausea with or without Vomiting:  Nausea in pregnancy is caused by increased levels of hormones in the body which influence the digestive system and cause irritation when stomach acids accumulate.  Symptoms usually subside after 1st trimester of pregnancy.  Try the following: 1. Keep saltines, graham crackers or dry toast by your bed to eat upon awakening. 2. Don't let your stomach get empty.  Try to eat 5-6 small meals per day instead of 3 large ones. 3. Avoid greasy fatty or highly seasoned foods.  4. Take OTC Unisom 1 tablet at bed time along with OTC Vitamin B6 25-50 mg 3 times per day.    If nausea continues with vomiting and you are unable to keep down food and fluids you may need a prescription medication.  Please notify your provider.   Sore throat:  Chloraseptic spray, throat lozenges and or plain Tylenol.  Vaginal Yeast Infection:  OTC Monistat for 7 days as  directed on label.  If symptoms do not resolve within a week notify provider.  If any of the above problems do not subside with recommended treatment please call the office for further assistance.   Do not take Aspirin, Advil, Motrin or Ibuprofen.  * * OTC= Over the counter   Dental Work and Pregnancy Proper dental care before, during, and after pregnancy is important for you and your baby. Pregnancy hormones can sometimes cause the gums to swell, which makes it easier for food to become trapped between teeth. The health of your teeth and gums can affect your growing baby.  DENTAL CARE RECOMMENDATIONS To help prevent infection and maintain healthy teeth and gums, a thorough oral examination is recommended for all women in the first trimester of pregnancy. Routine cleanings and examinations are recommended throughout pregnancy.  DENTAL CARE CONSIDERATIONS  Tell your dentist if you are pregnant or want to become pregnant.   If you are pregnant, routine X-ray exams should be avoided until after your baby is born. However, they do not need to be avoided if you are trying to become pregnant.   If you need an emergency procedure that includes a dental X-ray exam during pregnancy, very low levels of radiation will be emitted from the X-ray machines. Lead aprons can be used for protection of the chest, abdomen, and thyroid.  Your dentist will help you weigh the risks and benefits of dental procedures during pregnancy. If possible, it is  best to have dental procedures (such as cavity fillings and crown repair) during the second trimester of pregnancy or after the baby is born.   If you and your dentist decide to postpone a procedure for any reason, your dentist can suggest treatment to reduce the chance of infection until the procedure is performed.  HOME CARE INSTRUCTIONS   Follow good oral hygiene habits at home.  Brush your teeth twice daily with fluoride toothpaste. Brush thoroughly for at  least 2 minutes. Avoid strong flavored toothpastes if you have morning sickness.   Floss at least once daily.   Eat a well-balanced diet low in sugar and carbohydrates.   See your dentist for normal interval oral examinations and cleanings.   If you vomit, rinse your mouth with water afterward.   Make a dental appointment if you experience oral problems.   Follow up with your dentist as directed.  SEEK DENTAL CARE IF:  Oral symptoms such as pain, bleeding, swelling, or inflammation develop or worsen.   You develop growths or swelling in between teeth.   You develop signs of infection, such as:   A fever of more than 100.5 F (38.1 C).   Chills. This information is not intended to replace advice given to you by your health care provider. Make sure you discuss any questions you have with your health care provider. Document Released: 05/21/2010 Document Revised: 03/24/2016 Document Reviewed: 05/04/2013 Elsevier Interactive Patient Education  2017 [employer].

## 2017-02-06 NOTE — Progress Notes (Signed)
ROB-Pt doing well. Reports bleeding gums. Encouraged dental visit, mouthwash, and use of soft bristle toothbrush. [facility]. Discussed red flag symptoms and when to call. RTC x 4 weeks for ROB.

## 2017-02-06 NOTE — Progress Notes (Signed)
Pt presents for a routine OB visit.

## 2017-02-13 ENCOUNTER — Emergency Department
Admission: EM | Admit: 2017-02-13 | Discharge: 2017-02-13 | Disposition: A | Discharge status: Home / Self Care | Payer: Medicaid Other | Attending: Emergency Medicine | Admitting: Emergency Medicine

## 2017-02-13 ENCOUNTER — Encounter: Payer: Self-pay | Admitting: Emergency Medicine

## 2017-02-13 DIAGNOSIS — R109 Unspecified abdominal pain: Secondary | ICD-10-CM | POA: Insufficient documentation

## 2017-02-13 DIAGNOSIS — O26892 Other specified pregnancy related conditions, second trimester: Secondary | ICD-10-CM | POA: Diagnosis not present

## 2017-02-13 DIAGNOSIS — Z3A17 17 weeks gestation of pregnancy: Secondary | ICD-10-CM | POA: Diagnosis not present

## 2017-02-13 LAB — COMPREHENSIVE METABOLIC PANEL
ALT: 15 U/L (ref 14–54)
AST: 20 U/L (ref 15–41)
Albumin: 3.6 g/dL (ref 3.5–5.0)
Alkaline Phosphatase: 41 U/L (ref 38–126)
Anion gap: 5 (ref 5–15)
BILIRUBIN TOTAL: 0.7 mg/dL (ref 0.3–1.2)
BUN: 7 mg/dL (ref 6–20)
CHLORIDE: 103 mmol/L (ref 101–111)
CO2: 24 mmol/L (ref 22–32)
CREATININE: 0.44 mg/dL (ref 0.44–1.00)
Calcium: 9 mg/dL (ref 8.9–10.3)
Glucose, Bld: 82 mg/dL (ref 65–99)
POTASSIUM: 3.9 mmol/L (ref 3.5–5.1)
Sodium: 132 mmol/L — ABNORMAL LOW (ref 135–145)
TOTAL PROTEIN: 6.9 g/dL (ref 6.5–8.1)

## 2017-02-13 LAB — CBC
HEMATOCRIT: 31.8 % — AB (ref 35.0–47.0)
Hemoglobin: 10.9 g/dL — ABNORMAL LOW (ref 12.0–16.0)
MCH: 31.1 pg (ref 26.0–34.0)
MCHC: 34.3 g/dL (ref 32.0–36.0)
MCV: 90.7 fL (ref 80.0–100.0)
PLATELETS: 217 10*3/uL (ref 150–440)
RBC: 3.51 MIL/uL — AB (ref 3.80–5.20)
RDW: 14.9 % — ABNORMAL HIGH (ref 11.5–14.5)
WBC: 8.9 10*3/uL (ref 3.6–11.0)

## 2017-02-13 LAB — URINALYSIS, COMPLETE (UACMP) WITH MICROSCOPIC
BILIRUBIN URINE: NEGATIVE
GLUCOSE, UA: NEGATIVE mg/dL
HGB URINE DIPSTICK: NEGATIVE
Ketones, ur: NEGATIVE mg/dL
LEUKOCYTES UA: NEGATIVE
NITRITE: NEGATIVE
Protein, ur: NEGATIVE mg/dL
RBC / HPF: NONE SEEN RBC/hpf (ref 0–5)
SPECIFIC GRAVITY, URINE: 1.011 (ref 1.005–1.030)
SQUAMOUS EPITHELIAL / LPF: NONE SEEN
WBC, UA: NONE SEEN WBC/hpf (ref 0–5)
pH: 8 (ref 5.0–8.0)

## 2017-02-13 LAB — LIPASE, BLOOD: LIPASE: 14 U/L (ref 11–51)

## 2017-02-13 LAB — PREGNANCY, URINE: Preg Test, Ur: POSITIVE — AB

## 2017-02-13 NOTE — ED Provider Notes (Signed)
[facility] Emergency Department Provider Note    ____________________________________________   I have reviewed the triage vital signs and the nursing notes.   HISTORY  Chief Complaint Abdominal Pain   History limited by: Not Limited   HPI Meagan Butler is a [age] y.o. female at roughly [redacted] weeks pregnant who presents to the emergency department today because of abdominal pain. It is located over her right side of her abdomen. Patient states pain started yesterday. Has been fairly constant since then with very minimal waxing and waning. Patient has not noticed anything that brings the pain on. She denies any associated nausea or vomiting. She denies any change in defecation. No dysuria. No fevers. No vaginal bleeding or abnormal discharge.    Past Medical History:  Diagnosis Date  . Anemia   . Depression     There are no active problems to display for this patient.   Past Surgical History:  Procedure Laterality Date  . NO PAST SURGERIES      Prior to Admission medications   Medication Sig Start Date End Date Taking? Authorizing Provider  Prenat w/o A FeCbnFeGlu-FA &B6 (CITRANATAL B-CALM) 20-1 & 25 (2) MG MISC Take 25 mg by mouth daily. 12/11/16   Gunnar BullaJenkins Michelle Lawhorn, CNM    Allergies Patient has no known allergies.  Family History  Problem Relation Age of Onset  . Adopted: Yes    Social History Social History  Substance Use Topics  . Smoking status: Never Smoker  . Smokeless tobacco: Never Used  . Alcohol use No    Review of Systems  Constitutional: Negative for fever. Cardiovascular: Negative for chest pain. Respiratory: Negative for shortness of breath. Gastrointestinal: Positive for right sided abdominal pain. Genitourinary: Negative for dysuria. Musculoskeletal: Negative for back pain. Skin: Negative for rash. Neurological: Negative for headaches, focal weakness or numbness.  10-point ROS otherwise  negative.  ____________________________________________   PHYSICAL EXAM:  VITAL SIGNS: ED Triage Vitals  Enc Vitals Group     BP 02/13/17 1604 112/73     Pulse Rate 02/13/17 1604 79     Resp 02/13/17 1604 18     Temp 02/13/17 1604 98.1 F (36.7 C)     Temp Source 02/13/17 1604 Oral     SpO2 02/13/17 1604 100 %     Weight 02/13/17 1603 85 lb (38.6 kg)     Height 02/13/17 1603 5\' 2"  (1.575 m)     Head Circumference --      Peak Flow --      Pain Score 02/13/17 1603 4   Constitutional: Alert and oriented. Well appearing and in no distress. Eyes: Conjunctivae are normal. Normal extraocular movements. ENT   Head: Normocephalic and atraumatic.   Nose: No congestion/rhinnorhea.   Mouth/Throat: Mucous membranes are moist.   Neck: No stridor. Hematological/Lymphatic/Immunilogical: No cervical lymphadenopathy. Cardiovascular: Normal rate, regular rhythm.  No murmurs, rubs, or gallops.  Respiratory: Normal respiratory effort without tachypnea nor retractions. Breath sounds are clear and equal bilaterally. No wheezes/rales/rhonchi. Gastrointestinal: Soft and minimally tender over the right side of the abdomen. No rebound. No guarding. Negative rovsings Genitourinary: Deferred Musculoskeletal: Normal range of motion in all extremities. No lower extremity edema. Neurologic:  Normal speech and language. No gross focal neurologic deficits are appreciated.  Skin:  Skin is warm, dry and intact. No rash noted. Psychiatric: Mood and affect are normal. Speech and behavior are normal. Patient exhibits appropriate insight and judgment.  ____________________________________________    LABS (pertinent positives/negatives)  Labs Reviewed  COMPREHENSIVE METABOLIC PANEL - Abnormal; Notable for the following:       Result Value   Sodium 132 (*)    All other components within normal limits  CBC - Abnormal; Notable for the following:    RBC 3.51 (*)    Hemoglobin 10.9 (*)    HCT  31.8 (*)    RDW 14.9 (*)    All other components within normal limits  URINALYSIS, COMPLETE (UACMP) WITH MICROSCOPIC - Abnormal; Notable for the following:    Color, Urine YELLOW (*)    APPearance CLOUDY (*)    Bacteria, UA RARE (*)    All other components within normal limits  LIPASE, BLOOD  PREGNANCY, URINE     ____________________________________________   EKG  None  ____________________________________________    RADIOLOGY  None  ____________________________________________   PROCEDURES  Procedures  ____________________________________________   INITIAL IMPRESSION / ASSESSMENT AND PLAN / ED COURSE  Pertinent labs & imaging results that were available during my care of the patient were reviewed by me and considered in my medical decision making (see chart for details).  Patient presented to the emergency department today with right-sided abdominal pain on exam patient is minimally tender over the right side without any rebound or guarding. Patient had no leukocytosis. No fevers. Patient is hungry. I did discuss possibility of appendicitis and patient however at this time I do think it is of low possibility. I did however offer to perform MRI which patient declined at this time. We did discuss appendicitis return precautions. Additionally I offered to perform an ultrasound to evaluate the ovaries however patient declined this as well. I do think this is reasonable given that the patient's pain is located more in the mid right abdomen rather than in the pelvis. Think possible this is related to the pregnancy and expansion of the uterus. Will have patient follow-up with OB/GYN doctor. We did discuss return precautions.  ____________________________________________   FINAL CLINICAL IMPRESSION(S) / ED DIAGNOSES  Final diagnoses:  Right sided abdominal pain     Note: This dictation was prepared with Dragon dictation. Any transcriptional errors that result from this  process are unintentional     Phineas Semen, MD 02/13/17 1728

## 2017-02-13 NOTE — ED Triage Notes (Addendum)
Pt c/o right side abdominal pain. Denies NVD. Denies fevers. No urinary or vaginal sx. NAD. Ambulatory to triage. Pt is [redacted] weeks pregnant.  G1P0

## 2017-02-13 NOTE — Discharge Instructions (Signed)
Please seek medical attention for any high fevers, chest pain, shortness of breath, change in behavior, persistent vomiting, bloody stool or any other new or concerning symptoms.  

## 2017-03-06 ENCOUNTER — Encounter: Payer: Self-pay | Admitting: Certified Nurse Midwife

## 2017-03-06 ENCOUNTER — Ambulatory Visit (INDEPENDENT_AMBULATORY_CARE_PROVIDER_SITE_OTHER): Payer: Medicaid Other | Admitting: Certified Nurse Midwife

## 2017-03-06 ENCOUNTER — Ambulatory Visit (INDEPENDENT_AMBULATORY_CARE_PROVIDER_SITE_OTHER): Payer: Medicaid Other

## 2017-03-06 VITALS — BP 107/69 | HR 81 | Wt 89.2 lb

## 2017-03-06 DIAGNOSIS — Z681 Body mass index (BMI) 19 or less, adult: Secondary | ICD-10-CM

## 2017-03-06 DIAGNOSIS — Z3492 Encounter for supervision of normal pregnancy, unspecified, second trimester: Secondary | ICD-10-CM

## 2017-03-06 DIAGNOSIS — Z3687 Encounter for antenatal screening for uncertain dates: Secondary | ICD-10-CM

## 2017-03-06 DIAGNOSIS — Z3401 Encounter for supervision of normal first pregnancy, first trimester: Secondary | ICD-10-CM

## 2017-03-06 LAB — POCT URINALYSIS DIPSTICK
BILIRUBIN UA: NEGATIVE
GLUCOSE UA: NEGATIVE
KETONES UA: NEGATIVE
Leukocytes, UA: NEGATIVE
Nitrite, UA: NEGATIVE
PH UA: 7.5 (ref 5.0–8.0)
Protein, UA: NEGATIVE
RBC UA: NEGATIVE
Spec Grav, UA: 1.01 (ref 1.030–1.035)
Urobilinogen, UA: NEGATIVE (ref ?–2.0)

## 2017-03-06 NOTE — Patient Instructions (Addendum)
Eating Plan for Pregnant Women While you are pregnant, your body will require additional nutrition to help support your growing baby. It is recommended that you consume:  150 additional calories each day during your first trimester.  300 additional calories each day during your second trimester.  300 additional calories each day during your third trimester. Eating a healthy, well-balanced diet is very important for your health and for your baby's health. You also have a higher need for some vitamins and minerals, such as folic acid, calcium, iron, and vitamin D. What do I need to know about eating during pregnancy?  Do not try to lose weight or go on a diet during pregnancy.  Choose healthy, nutritious foods. Choose  of a sandwich with a glass of milk instead of a candy bar or a high-calorie sugar-sweetened beverage.  Limit your overall intake of foods that have "empty calories." These are foods that have little nutritional value, such as sweets, desserts, candies, sugar-sweetened beverages, and fried foods.  Eat a variety of foods, especially fruits and vegetables.  Take a prenatal vitamin to help meet the additional needs during pregnancy, specifically for folic acid, iron, calcium, and vitamin D.  Remember to stay active. Ask your health care provider for exercise recommendations that are specific to you.  Practice good food safety and cleanliness, such as washing your hands before you eat and after you prepare raw meat. This helps to prevent foodborne illnesses, such as listeriosis, that can be very dangerous for your baby. Ask your health care provider for more information about listeriosis. What does 150 extra calories look like? Healthy options for an additional 150 calories each day could be any of the following:  Plain low-fat yogurt (6-8 oz) with  cup of berries.  1 apple with 2 teaspoons of peanut butter.  Cut-up vegetables with  cup of hummus.  Low-fat chocolate milk  (8 oz or 1 cup).  1 string cheese with 1 medium orange.   of a peanut butter and jelly sandwich on whole-wheat bread (1 tsp of peanut butter). For 300 calories, you could eat two of those healthy options each day. What is a healthy amount of weight to gain? The recommended amount of weight for you to gain is based on your pre-pregnancy BMI. If your pre-pregnancy BMI was:  Less than 18 (underweight), you should gain 28-40 lb.  18-24.9 (normal), you should gain 25-35 lb.  25-29.9 (overweight), you should gain 15-25 lb.  Greater than 30 (obese), you should gain 11-20 lb. What if I am having twins or multiples? Generally, pregnant women who will be having twins or multiples may need to increase their daily calories by 300-600 calories each day. The recommended range for total weight gain is 25-54 lb, depending on your pre-pregnancy BMI. Talk with your health care provider for specific guidance about additional nutritional needs, weight gain, and exercise during your pregnancy. What foods can I eat? Grains  Any grains. Try to choose whole grains, such as whole-wheat bread, oatmeal, or brown rice. Vegetables  Any vegetables. Try to eat a variety of colors and types of vegetables to get a full range of vitamins and minerals. Remember to wash your vegetables well before eating. Fruits  Any fruits. Try to eat a variety of colors and types of fruit to get a full range of vitamins and minerals. Remember to wash your fruits well before eating. Meats and Other Protein Sources  Lean meats, including chicken, turkey, fish, and lean cuts of beef,   veal, or pork. Make sure that all meats are cooked to "well done." Tofu. Tempeh. Beans. Eggs. Peanut butter and other nut butters. Seafood, such as shrimp, crab, and lobster. If you choose fish, select types that are higher in omega-3 fatty acids, including salmon, herring, mussels, trout, sardines, and pollock. Make sure that all meats are cooked to food-safe  temperatures. Dairy  Pasteurized milk and milk alternatives. Pasteurized yogurt and pasteurized cheese. Cottage cheese. Sour cream. Beverages  Water. Juices that contain 100% fruit juice or vegetable juice. Caffeine-free teas and decaffeinated coffee. Drinks that contain caffeine are okay to drink, but it is better to avoid caffeine. Keep your total caffeine intake to less than 200 mg each day (12 oz of coffee, tea, or soda) or as directed by your health care provider. Condiments  Any pasteurized condiments. Sweets and Desserts  Any sweets and desserts. Fats and Oils  Any fats and oils. The items listed above may not be a complete list of recommended foods or beverages. Contact your dietitian for more options.  What foods are not recommended? Vegetables  Unpasteurized (raw) vegetable juices. Fruits  Unpasteurized (raw) fruit juices. Meats and Other Protein Sources  Cured meats that have nitrates, such as bacon, salami, and hotdogs. Luncheon meats, bologna, or other deli meats (unless they are reheated until they are steaming hot). Refrigerated pate, meat spreads from a meat counter, smoked seafood that is found in the refrigerated section of a store. Raw fish, such as sushi or sashimi. High mercury content fish, such as tilefish, shark, swordfish, and king mackerel. Raw meats, such as tuna or beef tartare. Undercooked meats and poultry. Make sure that all meats are cooked to food-safe temperatures. Dairy  Unpasteurized (raw) milk and any foods that have raw milk in them. Soft cheeses, such as feta, queso blanco, queso fresco, Brie, Camembert cheeses, blue-veined cheeses, and Panela cheese (unless it is made with pasteurized milk, which must be stated on the label). Beverages  Alcohol. Sugar-sweetened beverages, such as sodas, teas, or energy drinks. Condiments  Homemade fermented foods and drinks, such as pickles, sauerkraut, or kombucha drinks. (Store-bought pasteurized versions of these are  okay.) Other  Salads that are made in the store, such as ham salad, chicken salad, egg salad, tuna salad, and seafood salad. The items listed above may not be a complete list of foods and beverages to avoid. Contact your dietitian for more information.  This information is not intended to replace advice given to you by your health care provider. Make sure you discuss any questions you have with your health care provider. Document Released: 09/15/2014 Document Revised: 05/08/2016 Document Reviewed: 05/16/2014 Elsevier Interactive Patient Education  2017 [employer] Inc[employer]  Round Ligament Pain The round ligament is a cord of muscle and tissue that helps to support the uterus. It can become a source of pain during pregnancy if it becomes stretched or twisted as the baby grows. The pain usually begins in the second trimester of pregnancy, and it can come and go until the baby is delivered. It is not a serious problem, and it does not cause harm to the baby. Round ligament pain is usually a short, sharp, and pinching pain, but it can also be a dull, lingering, and aching pain. The pain is felt in the lower side of the abdomen or in the groin. It usually starts deep in the groin and moves up to the outside of the hip area. Pain can occur with:  A sudden change in position.  Rolling over in bed.  Coughing or sneezing.  Physical activity. Follow these instructions at home: Watch your condition for any changes. Take these steps to help with your pain:  When the pain starts, relax. Then try:  Sitting down.  Flexing your knees up to your abdomen.  Lying on your side with one pillow under your abdomen and another pillow between your legs.  Sitting in a warm bath for 15-20 minutes or until the pain goes away.  Take over-the-counter and prescription medicines only as told by your health care provider.  Move slowly when you sit and stand.  Avoid long walks if they cause pain.  Stop or lessen your  physical activities if they cause pain. Contact a health care provider if:  Your pain does not go away with treatment.  You feel pain in your back that you did not have before.  Your medicine is not helping. Get help right away if:  You develop a fever or chills.  You develop uterine contractions.  You develop vaginal bleeding.  You develop nausea or vomiting.  You develop diarrhea.  You have pain when you urinate. This information is not intended to replace advice given to you by your health care provider. Make sure you discuss any questions you have with your health care provider. Document Released: 09/09/2008 Document Revised: 05/08/2016 Document Reviewed: 02/07/2015 Elsevier Interactive Patient Education  2017 [employer]

## 2017-03-06 NOTE — Progress Notes (Signed)
Meagan Butler, Pregnancy Case Manager, met with patient to discuss [employer] and other programs available during pregnancy. Pt seen 02/13/2017 in ER for right sided abdominal pain. Discussed round ligament pain and home treatment measures. Reviewed US findings, anatomy WNL. Reiterated healthy eating options during pregnancy. Encouraged nutritional supplements and follow up with [facility]. Referral already completed. Vitamin levels today, see orders. RTC x 4 weeks for ROB.   ULTRASOUND REPORT  Location: [facility] Date of Service: 03/06/17  Indications: Anatomy Findings:  Singleton intrauterine pregnancy is visualized with FHR at 157 BPM. Biometrics give an (U/S) Gestational age of [redacted] weeks and an (U/S) EDD of 07/24/17; this correlates with the clinically established EDD of 07/24/17.  Fetal presentation is breech, spine variable.  EFW: 332 g ( 0 lbs. 12 oz). Placenta: Anterior, grade 0 and remote to cervix at 4.4 cm. AFI: Subjectively adequate with an MVP of 3.8  Anatomic survey is complete and appears WNL. Gender - Female.   Right Ovary measures 2.2 x 1.0 x 1.5 cm, and appears WNL. Left Ovary measures 2.8 x 1.8 x 1.7 cm, and appears WNL. There is no evidence of a corpus luteal cyst. Survey of the adnexa demonstrates no adnexal masses. There is no free peritoneal fluid in the cul de sac.  Impression: 1. 20 week Viable Singleton Intrauterine pregnancy by U/S. 2. (U/S) EDD is consistent with Clinically established (LMP) EDD of 07/24/17. 3. Normal appearing anatomy scan.

## 2017-03-06 NOTE — Progress Notes (Signed)
Pt is here for a prenatal visit. 

## 2017-03-10 LAB — COMPREHENSIVE METABOLIC PANEL
A/G RATIO: 1.4 (ref 1.2–2.2)
ALBUMIN: 3.7 g/dL (ref 3.5–5.5)
ALK PHOS: 58 IU/L (ref 39–117)
ALT: 17 IU/L (ref 0–32)
AST: 22 IU/L (ref 0–40)
BILIRUBIN TOTAL: 0.5 mg/dL (ref 0.0–1.2)
BUN/Creatinine Ratio: 12 (ref 9–23)
BUN: 7 mg/dL (ref 6–20)
CO2: 24 mmol/L (ref 18–29)
CREATININE: 0.6 mg/dL (ref 0.57–1.00)
Calcium: 9 mg/dL (ref 8.7–10.2)
Chloride: 102 mmol/L (ref 96–106)
GFR calc Af Amer: 151 mL/min/{1.73_m2} (ref >59)
GFR calc non Af Amer: 131 mL/min/{1.73_m2} (ref >59)
GLOBULIN, TOTAL: 2.7 g/dL (ref 1.5–4.5)
Glucose: 81 mg/dL (ref 65–99)
Potassium: 3.9 mmol/L (ref 3.5–5.2)
SODIUM: 137 mmol/L (ref 134–144)
Total Protein: 6.4 g/dL (ref 6.0–8.5)

## 2017-03-10 LAB — VITAMIN D 1,25 DIHYDROXY
VITAMIN D 1, 25 (OH) TOTAL: 137 pg/mL
Vitamin D2 1, 25 (OH)2: <10 pg/mL
Vitamin D3 1, 25 (OH)2: 137 pg/mL

## 2017-03-10 LAB — VITAMIN B12: VITAMIN B 12: 593 pg/mL (ref 232–1245)

## 2017-03-10 LAB — FOLATE: Folate: 18.8 ng/mL (ref >3.0)

## 2017-04-06 ENCOUNTER — Ambulatory Visit (INDEPENDENT_AMBULATORY_CARE_PROVIDER_SITE_OTHER): Payer: Medicaid Other | Admitting: Certified Nurse Midwife

## 2017-04-06 ENCOUNTER — Encounter: Payer: Self-pay | Admitting: Certified Nurse Midwife

## 2017-04-06 VITALS — BP 112/66 | HR 86 | Wt 97.4 lb

## 2017-04-06 DIAGNOSIS — Z681 Body mass index (BMI) 19 or less, adult: Secondary | ICD-10-CM

## 2017-04-06 DIAGNOSIS — Z3492 Encounter for supervision of normal pregnancy, unspecified, second trimester: Secondary | ICD-10-CM

## 2017-04-06 DIAGNOSIS — Z130 Encounter for screening for diseases of the blood and blood-forming organs and certain disorders involving the immune mechanism: Secondary | ICD-10-CM

## 2017-04-06 DIAGNOSIS — Z131 Encounter for screening for diabetes mellitus: Secondary | ICD-10-CM

## 2017-04-06 LAB — POCT URINALYSIS DIPSTICK
BILIRUBIN UA: NEGATIVE
Blood, UA: NEGATIVE
GLUCOSE UA: NEGATIVE
Ketones, UA: NEGATIVE
LEUKOCYTES UA: NEGATIVE
NITRITE UA: NEGATIVE
Protein, UA: NEGATIVE
Spec Grav, UA: 1.01 (ref 1.010–1.025)
Urobilinogen, UA: NEGATIVE E.U./dL — AB
pH, UA: 5 (ref 5.0–8.0)

## 2017-04-06 NOTE — Progress Notes (Signed)
Pt is here for a routine prenatal visit. 

## 2017-04-06 NOTE — Addendum Note (Signed)
Addended by: Shaune Spittle on: 04/06/2017 03:54 PM   Modules accepted: Orders

## 2017-04-06 NOTE — Patient Instructions (Signed)
Round Ligament Pain The round ligament is a cord of muscle and tissue that helps to support the uterus. It can become a source of pain during pregnancy if it becomes stretched or twisted as the baby grows. The pain usually begins in the second trimester of pregnancy, and it can come and go until the baby is delivered. It is not a serious problem, and it does not cause harm to the baby. Round ligament pain is usually a short, sharp, and pinching pain, but it can also be a dull, lingering, and aching pain. The pain is felt in the lower side of the abdomen or in the groin. It usually starts deep in the groin and moves up to the outside of the hip area. Pain can occur with:  A sudden change in position.  Rolling over in bed.  Coughing or sneezing.  Physical activity. Follow these instructions at home: Watch your condition for any changes. Take these steps to help with your pain:  When the pain starts, relax. Then try:  Sitting down.  Flexing your knees up to your abdomen.  Lying on your side with one pillow under your abdomen and another pillow between your legs.  Sitting in a warm bath for 15-20 minutes or until the pain goes away.  Take over-the-counter and prescription medicines only as told by your health care provider.  Move slowly when you sit and stand.  Avoid long walks if they cause pain.  Stop or lessen your physical activities if they cause pain. Contact a health care provider if:  Your pain does not go away with treatment.  You feel pain in your back that you did not have before.  Your medicine is not helping. Get help right away if:  You develop a fever or chills.  You develop uterine contractions.  You develop vaginal bleeding.  You develop nausea or vomiting.  You develop diarrhea.  You have pain when you urinate. This information is not intended to replace advice given to you by your health care provider. Make sure you discuss any questions you have with  your health care provider. Document Released: 09/09/2008 Document Revised: 05/08/2016 Document Reviewed: 02/07/2015 Elsevier Interactive Patient Education  2017 [employer]  

## 2017-04-06 NOTE — Progress Notes (Signed)
ROB-Pt doing well. Drinking chocolate milk with carnation daily and boost when working. Pt has not been to [facility] yet. [place] trip [place] 2-9, 2018. Discussed travel precautions when pregnant. Reviewed red flag symptoms and when to call. RTC x 4 weeks for ROB and glucola.

## 2017-05-01 ENCOUNTER — Other Ambulatory Visit: Payer: Medicaid Other

## 2017-05-01 ENCOUNTER — Ambulatory Visit (INDEPENDENT_AMBULATORY_CARE_PROVIDER_SITE_OTHER): Payer: Medicaid Other | Admitting: Certified Nurse Midwife

## 2017-05-01 VITALS — BP 100/61 | HR 85 | Wt 100.1 lb

## 2017-05-01 DIAGNOSIS — Z130 Encounter for screening for diseases of the blood and blood-forming organs and certain disorders involving the immune mechanism: Secondary | ICD-10-CM

## 2017-05-01 DIAGNOSIS — Z3403 Encounter for supervision of normal first pregnancy, third trimester: Secondary | ICD-10-CM

## 2017-05-01 DIAGNOSIS — Z23 Encounter for immunization: Secondary | ICD-10-CM

## 2017-05-01 DIAGNOSIS — Z131 Encounter for screening for diabetes mellitus: Secondary | ICD-10-CM

## 2017-05-01 DIAGNOSIS — Z3492 Encounter for supervision of normal pregnancy, unspecified, second trimester: Secondary | ICD-10-CM

## 2017-05-01 LAB — POCT URINALYSIS DIPSTICK
Bilirubin, UA: NEGATIVE
Blood, UA: NEGATIVE
KETONES UA: NEGATIVE
Leukocytes, UA: NEGATIVE
Nitrite, UA: NEGATIVE
Protein, UA: NEGATIVE
SPEC GRAV UA: 1.015 (ref 1.010–1.025)
UROBILINOGEN UA: 0.2 U/dL
pH, UA: 7 (ref 5.0–8.0)

## 2017-05-01 MED ORDER — TETANUS-DIPHTH-ACELL PERTUSSIS 5-2.5-18.5 LF-MCG/0.5 IM SUSP: 0.5000 mL | Freq: Once | INTRAMUSCULAR | Status: DC

## 2017-05-01 NOTE — Patient Instructions (Signed)

## 2017-05-01 NOTE — Progress Notes (Signed)
ROB, doing well. GTT , cbc, Tdap, and blood consent today. She denies loss of fluid, vag bleeding and contractions. Will return in 2 wks.   Doreene BurkeAnnie Seiji Wiswell, CNM

## 2017-05-02 LAB — HEMOGLOBIN AND HEMATOCRIT, BLOOD
Hematocrit: 30.1 % — ABNORMAL LOW (ref 34.0–46.6)
Hemoglobin: 9.8 g/dL — ABNORMAL LOW (ref 11.1–15.9)

## 2017-05-02 LAB — GLUCOSE TOLERANCE, 1 HOUR: GLUCOSE, 1HR PP: 165 mg/dL (ref 65–199)

## 2017-05-06 ENCOUNTER — Encounter: Payer: Medicaid Other | Admitting: Certified Nurse Midwife

## 2017-05-06 ENCOUNTER — Other Ambulatory Visit: Payer: Medicaid Other

## 2017-05-12 ENCOUNTER — Other Ambulatory Visit: Payer: Self-pay | Admitting: Certified Nurse Midwife

## 2017-05-12 DIAGNOSIS — R7309 Other abnormal glucose: Secondary | ICD-10-CM

## 2017-05-15 ENCOUNTER — Ambulatory Visit (INDEPENDENT_AMBULATORY_CARE_PROVIDER_SITE_OTHER): Payer: Medicaid Other | Admitting: Certified Nurse Midwife

## 2017-05-15 ENCOUNTER — Encounter: Payer: Self-pay | Admitting: Certified Nurse Midwife

## 2017-05-15 VITALS — BP 111/67 | HR 91 | Wt 101.2 lb

## 2017-05-15 DIAGNOSIS — Z3493 Encounter for supervision of normal pregnancy, unspecified, third trimester: Secondary | ICD-10-CM

## 2017-05-15 LAB — POCT URINALYSIS DIPSTICK
Bilirubin, UA: NEGATIVE
Blood, UA: NEGATIVE
GLUCOSE UA: NEGATIVE
KETONES UA: NEGATIVE
LEUKOCYTES UA: NEGATIVE
Nitrite, UA: NEGATIVE
PROTEIN UA: NEGATIVE
Spec Grav, UA: 1.015 (ref 1.010–1.025)
UROBILINOGEN UA: 0.2 U/dL
pH, UA: 8.5 — AB (ref 5.0–8.0)

## 2017-05-15 NOTE — Patient Instructions (Addendum)
Glucose Tolerance Test The glucose tolerance test (GTT) is one of several tests used to diagnose diabetes mellitus. The GTT is a blood test, and it may include a urine test as well. The GTT checks to see how your body processes sugar (glucose). For this test, you will consume a drink containing a high level of glucose. Your blood glucose levels will be checked before you consume the drink and then again 1, 2, 3, and possibly 4 hours after you consume it. Your health care provider may recommend that you have the GTT if you:  Have a family history of diabetes.  Are very overweight (obese).  Have experienced infections that keep coming back.  Have had numerous cuts or wounds that did not heal quickly, especially on your legs and feet.  Are a woman and have a history of giving birth to very large babies or a history of repeated fetal loss (stillbirth).  Have had glucose in your urine or high blood sugar: ? During pregnancy. ? After a heart attack, surgery, or prolonged periods of high stress.  The GTT lasts 3-4 hours. Other than the glucose solution, you will not be allowed to eat or drink anything during the test. You must remain at the testing location to make sure that your blood and urine samples are taken on time. How do I prepare for this test? Eat normally for 3 days prior to the GTT test, including having plenty of carbohydrate-rich foods. Do not eat or drink anything except water during the final 12 hours before the test. You should not smoke or exercise during the test. In addition, your health care provider may ask you to stop taking certain medicines before the test. What do the results mean? It is your responsibility to obtain your test results. Ask the lab or department performing the test when and how you will get your results. Contact your health care provider to discuss any questions you have about your results. Range of Normal Values Ranges for normal values may vary among  different labs and hospitals. You should always check with your health care provider after having lab work or other tests done to discuss whether your values are considered within normal limits. Normal levels of blood glucose are as follows:  Fasting: less than 110 mg/dL or less than 6.1 mmol/L (SI units).  1 hour after consuming the glucose drink: less than 200 mg/dL or less than 11.1 mmol/L.  2 hours after consuming the glucose drink: less than 140 mg/dL or less than 7.8 mmol/L.  3 hours after consuming the glucose drink: 70-115 mg/dL or less than 6.4 mmol/L.  4 hours after consuming the glucose drink: 70-115 mg/dL or less than 6.4 mmol/L.  The normal result for the urine test is negative, meaning that glucose is absent from your urine. Some substances can interfere with GTT results. These may include:  Blood pressure and heart failure medicines, including beta blockers, furosemide, and thiazides.  Anti-inflammatory medicines, including aspirin.  Nicotine.  Some psychiatric medicines.  Oral contraceptives.  Diuretics or corticosteroids.  Meaning of Results Outside Normal Value Ranges GTT test results that are above normal values may indicate health problems, such as:  Diabetes mellitus.  Acute stress response.  Cushing syndrome.  Tumors such as pheochromocytoma or glucagonoma.  Chronic renal failure.  Pancreatitis.  Hyperthyroidism.  Current infection.  Discuss your test results with your health care provider. He or she will use the results to make a diagnosis and determine a treatment plan  that is right for you. Talk with your health care provider to discuss your results, treatment options, and if necessary, the need for more tests. Talk with your health care provider if you have any questions about your results. This information is not intended to replace advice given to you by your health care provider. Make sure you discuss any questions you have with your  health care provider. Document Released: 12/24/2004 Document Revised: 08/05/2016 Document Reviewed: 04/07/2014 Elsevier Interactive Patient Education  2017 [employer]  

## 2017-05-15 NOTE — Progress Notes (Signed)
Pt is here for a regular OB visit. Offers no complaints.

## 2017-05-17 NOTE — Progress Notes (Signed)
ROB-Pt doing well, no questions or concerns. Discussed results of glucola and need for three (3) hour GTT, pt verbalized understanding.  Meeting with Myriam JacobsonMarilyn, medicaid pregnancy case manager, after today's visit.Reviewed red flag symptoms and when to call. RTC ASAP for three (3) hour GTT. RTC x 2 weeks for ROB or sooner if needed.

## 2017-06-01 ENCOUNTER — Encounter: Payer: Self-pay | Admitting: Certified Nurse Midwife

## 2017-06-01 ENCOUNTER — Other Ambulatory Visit: Payer: Medicaid Other

## 2017-06-01 ENCOUNTER — Ambulatory Visit (INDEPENDENT_AMBULATORY_CARE_PROVIDER_SITE_OTHER): Payer: Medicaid Other | Admitting: Certified Nurse Midwife

## 2017-06-01 VITALS — BP 108/76 | HR 88 | Wt 105.2 lb

## 2017-06-01 DIAGNOSIS — R7309 Other abnormal glucose: Secondary | ICD-10-CM

## 2017-06-01 DIAGNOSIS — R7302 Impaired glucose tolerance (oral): Secondary | ICD-10-CM

## 2017-06-01 DIAGNOSIS — Z3493 Encounter for supervision of normal pregnancy, unspecified, third trimester: Secondary | ICD-10-CM

## 2017-06-01 LAB — POCT URINALYSIS DIPSTICK
Bilirubin, UA: NEGATIVE
Ketones, UA: NEGATIVE
Leukocytes, UA: NEGATIVE
NITRITE UA: NEGATIVE
PH UA: 6 (ref 5.0–8.0)
PROTEIN UA: NEGATIVE
RBC UA: NEGATIVE
SPEC GRAV UA: 1.02 (ref 1.010–1.025)
UROBILINOGEN UA: 0.2 U/dL

## 2017-06-01 NOTE — Patient Instructions (Signed)

## 2017-06-01 NOTE — Addendum Note (Signed)
Addended by: Frankey ShownGLANTON, Tiegan Jambor C on: 06/01/2017 03:58 PM   Modules accepted: Orders

## 2017-06-01 NOTE — Progress Notes (Signed)
ROB, doing well. No complaints. 3 hr GTT today. Reviewed birth classes and cord blood banking today. Pt encouraged to sign up for classes. She denies LOF, Vag bleeding, and contractions. Follow up in 2 wks.  Doreene BurkeAnnie Katheen Aslin, CNM

## 2017-06-01 NOTE — Progress Notes (Signed)
Rob and 3 hr gtt- no complaints.

## 2017-06-02 LAB — GESTATIONAL GLUCOSE TOLERANCE
GLUCOSE 1 HOUR GTT: 177 mg/dL (ref 65–179)
GLUCOSE 3 HOUR GTT: 111 mg/dL (ref 65–139)
GLUCOSE FASTING: 74 mg/dL (ref 65–94)
Glucose, GTT - 2 Hour: 155 mg/dL — ABNORMAL HIGH (ref 65–154)

## 2017-07-01 ENCOUNTER — Ambulatory Visit (INDEPENDENT_AMBULATORY_CARE_PROVIDER_SITE_OTHER): Payer: Medicaid Other | Admitting: Certified Nurse Midwife

## 2017-07-01 VITALS — BP 100/73 | HR 106 | Wt 109.2 lb

## 2017-07-01 DIAGNOSIS — Z3685 Encounter for antenatal screening for Streptococcus B: Secondary | ICD-10-CM

## 2017-07-01 DIAGNOSIS — Z113 Encounter for screening for infections with a predominantly sexual mode of transmission: Secondary | ICD-10-CM

## 2017-07-01 DIAGNOSIS — Z3493 Encounter for supervision of normal pregnancy, unspecified, third trimester: Secondary | ICD-10-CM

## 2017-07-01 NOTE — Progress Notes (Signed)
ROB- cultures obtained, pt is doing well 

## 2017-07-01 NOTE — Progress Notes (Signed)
ROB, doing well. No complaints. Cultures today. SVE per pt request. Unable to reach cervix posterior due to pt discomfort during exam. Reviewed fetal movement. Follow up in one week  Doreene BurkeAnnie Tresten Pantoja, CNM

## 2017-07-01 NOTE — Patient Instructions (Signed)

## 2017-07-03 ENCOUNTER — Encounter: Payer: Self-pay | Admitting: Certified Nurse Midwife

## 2017-07-03 LAB — MONITOR DRUG PROFILE 14(MW)
Amphetamine Scrn, Ur: NEGATIVE ng/mL
BARBITURATE SCREEN URINE: NEGATIVE ng/mL
BENZODIAZEPINE SCREEN, URINE: NEGATIVE ng/mL
BUPRENORPHINE, URINE: NEGATIVE ng/mL
CANNABINOIDS UR QL SCN: NEGATIVE ng/mL
CREATININE(CRT), U: 94.1 mg/dL (ref 20.0–300.0)
Cocaine (Metab) Scrn, Ur: NEGATIVE ng/mL
FENTANYL, URINE: NEGATIVE pg/mL
Meperidine Screen, Urine: NEGATIVE ng/mL
Methadone Screen, Urine: NEGATIVE ng/mL
OPIATE SCREEN URINE: NEGATIVE ng/mL
OXYCODONE+OXYMORPHONE UR QL SCN: NEGATIVE ng/mL
PH UR, DRUG SCRN: 6.3 (ref 4.5–8.9)
PHENCYCLIDINE QUANTITATIVE URINE: NEGATIVE ng/mL
Propoxyphene Scrn, Ur: NEGATIVE ng/mL
SPECIFIC GRAVITY: 1.023
TRAMADOL SCREEN, URINE: NEGATIVE ng/mL

## 2017-07-03 LAB — STREP GP B NAA: Strep Gp B NAA: NEGATIVE

## 2017-07-05 LAB — GC/CHLAMYDIA PROBE AMP
Chlamydia trachomatis, NAA: NEGATIVE
Neisseria gonorrhoeae by PCR: NEGATIVE

## 2017-07-08 ENCOUNTER — Ambulatory Visit (INDEPENDENT_AMBULATORY_CARE_PROVIDER_SITE_OTHER): Payer: Medicaid Other | Admitting: Obstetrics and Gynecology

## 2017-07-08 VITALS — BP 102/76 | HR 102 | Wt 108.1 lb

## 2017-07-08 DIAGNOSIS — Z3493 Encounter for supervision of normal pregnancy, unspecified, third trimester: Secondary | ICD-10-CM

## 2017-07-08 LAB — POCT URINALYSIS DIPSTICK
BILIRUBIN UA: NEGATIVE
Glucose, UA: NEGATIVE
KETONES UA: NEGATIVE
Leukocytes, UA: NEGATIVE
NITRITE UA: NEGATIVE
PH UA: 7 (ref 5.0–8.0)
Protein, UA: NEGATIVE
RBC UA: NEGATIVE
Spec Grav, UA: 1.01 (ref 1.010–1.025)
Urobilinogen, UA: 0.2 E.U./dL

## 2017-07-08 NOTE — Patient Instructions (Signed)

## 2017-07-08 NOTE — Progress Notes (Signed)
ROB- pt is doing well, having some pelvic pressure 

## 2017-07-08 NOTE — Progress Notes (Signed)
ROB-discussed BC,circumcission, labor, volunteer doulas, etc. Declined cervical exam.

## 2017-07-16 ENCOUNTER — Encounter: Payer: Self-pay | Admitting: Certified Nurse Midwife

## 2017-07-16 ENCOUNTER — Ambulatory Visit (INDEPENDENT_AMBULATORY_CARE_PROVIDER_SITE_OTHER): Payer: Medicaid Other | Admitting: Certified Nurse Midwife

## 2017-07-16 VITALS — BP 109/75 | HR 89 | Wt 113.5 lb

## 2017-07-16 DIAGNOSIS — Z3493 Encounter for supervision of normal pregnancy, unspecified, third trimester: Secondary | ICD-10-CM

## 2017-07-16 LAB — POCT URINALYSIS DIPSTICK
Bilirubin, UA: NEGATIVE
Glucose, UA: NEGATIVE
Ketones, UA: NEGATIVE
Leukocytes, UA: NEGATIVE
NITRITE UA: NEGATIVE
PH UA: 7 (ref 5.0–8.0)
Protein, UA: NEGATIVE
RBC UA: NEGATIVE
Spec Grav, UA: 1.01 (ref 1.010–1.025)
UROBILINOGEN UA: 0.2 U/dL

## 2017-07-16 NOTE — Patient Instructions (Signed)
Vaginal Delivery Vaginal delivery means that you will give birth by pushing your baby out of your birth canal (vagina). A team of health care providers will help you before, during, and after vaginal delivery. Birth experiences are unique for every woman and every pregnancy, and birth experiences vary depending on where you choose to give birth. What should I do to prepare for my baby's birth? Before your baby is born, it is important to talk with your health care provider about:  Your labor and delivery preferences. These may include: ? Medicines that you may be given. ? How you will manage your pain. This might include non-medical pain relief techniques or injectable pain relief such as epidural analgesia. ? How you and your baby will be monitored during labor and delivery. ? Who may be in the labor and delivery room with you. ? Your feelings about surgical delivery of your baby (cesarean delivery, or C-section) if this becomes necessary. ? Your feelings about receiving donated blood through an IV tube (blood transfusion) if this becomes necessary.  Whether you are able: ? To take pictures or videos of the birth. ? To eat during labor and delivery. ? To move around, walk, or change positions during labor and delivery.  What to expect after your baby is born, such as: ? Whether delayed umbilical cord clamping and cutting is offered. ? Who will care for your baby right after birth. ? Medicines or tests that may be recommended for your baby. ? Whether breastfeeding is supported in your hospital or birth center. ? How long you will be in the hospital or birth center.  How any medical conditions you have may affect your baby or your labor and delivery experience.  To prepare for your baby's birth, you should also:  Attend all of your health care visits before delivery (prenatal visits) as recommended by your health care provider. This is important.  Prepare your home for your baby's  arrival. Make sure that you have: ? Diapers. ? Baby clothing. ? Feeding equipment. ? Safe sleeping arrangements for you and your baby.  Install a car seat in your vehicle. Have your car seat checked by a certified car seat installer to make sure that it is installed safely.  Think about who will help you with your new baby at home for at least the first several weeks after delivery.  What can I expect when I arrive at the birth center or hospital? Once you are in labor and have been admitted into the hospital or birth center, your health care provider may:  Review your pregnancy history and any concerns you have.  Insert an IV tube into one of your veins. This is used to give you fluids and medicines.  Check your blood pressure, pulse, temperature, and heart rate (vital signs).  Check whether your bag of water (amniotic sac) has broken (ruptured).  Talk with you about your birth plan and discuss pain control options.  Monitoring Your health care provider may monitor your contractions (uterine monitoring) and your baby's heart rate (fetal monitoring). You may need to be monitored:  Often, but not continuously (intermittently).  All the time or for long periods at a time (continuously). Continuous monitoring may be needed if: ? You are taking certain medicines, such as medicine to relieve pain or make your contractions stronger. ? You have pregnancy or labor complications.  Monitoring may be done by:  Placing a special stethoscope or a handheld monitoring device on your abdomen to   check your baby's heartbeat, and feeling your abdomen for contractions. This method of monitoring does not continuously record your baby's heartbeat or your contractions.  Placing monitors on your abdomen (external monitors) to record your baby's heartbeat and the frequency and length of contractions. You may not have to wear external monitors all the time.  Placing monitors inside of your uterus  (internal monitors) to record your baby's heartbeat and the frequency, length, and strength of your contractions. ? Your health care provider may use internal monitors if he or she needs more information about the strength of your contractions or your baby's heart rate. ? Internal monitors are put in place by passing a thin, flexible wire through your vagina and into your uterus. Depending on the type of monitor, it may remain in your uterus or on your baby's head until birth. ? Your health care provider will discuss the benefits and risks of internal monitoring with you and will ask for your permission before inserting the monitors.  Telemetry. This is a type of continuous monitoring that can be done with external or internal monitors. Instead of having to stay in bed, you are able to move around during telemetry. Ask your health care provider if telemetry is an option for you.  Physical exam Your health care provider may perform a physical exam. This may include:  Checking whether your baby is positioned: ? With the head toward your vagina (head-down). This is most common. ? With the head toward the top of your uterus (head-up or breech). If your baby is in a breech position, your health care provider may try to turn your baby to a head-down position so you can deliver vaginally. If it does not seem that your baby can be born vaginally, your provider may recommend surgery to deliver your baby. In rare cases, you may be able to deliver vaginally if your baby is head-up (breech delivery). ? Lying sideways (transverse). Babies that are lying sideways cannot be delivered vaginally.  Checking your cervix to determine: ? Whether it is thinning out (effacing). ? Whether it is opening up (dilating). ? How low your baby has moved into your birth canal.  What are the three stages of labor and delivery?  Normal labor and delivery is divided into the following three stages: Stage 1  Stage 1 is the  longest stage of labor, and it can last for hours or days. Stage 1 includes: ? Early labor. This is when contractions may be irregular, or regular and mild. Generally, early labor contractions are more than 10 minutes apart. ? Active labor. This is when contractions get longer, more regular, more frequent, and more intense. ? The transition phase. This is when contractions happen very close together, are very intense, and may last longer than during any other part of labor.  Contractions generally feel mild, infrequent, and irregular at first. They get stronger, more frequent (about every 2-3 minutes), and more regular as you progress from early labor through active labor and transition.  Many women progress through stage 1 naturally, but you may need help to continue making progress. If this happens, your health care provider may talk with you about: ? Rupturing your amniotic sac if it has not ruptured yet. ? Giving you medicine to help make your contractions stronger and more frequent.  Stage 1 ends when your cervix is completely dilated to 4 inches (10 cm) and completely effaced. This happens at the end of the transition phase. Stage 2  Once   your cervix is completely effaced and dilated to 4 inches (10 cm), you may start to feel an urge to push. It is common for the body to naturally take a rest before feeling the urge to push, especially if you received an epidural or certain other pain medicines. This rest period may last for up to 1-2 hours, depending on your unique labor experience.  During stage 2, contractions are generally less painful, because pushing helps relieve contraction pain. Instead of contraction pain, you may feel stretching and burning pain, especially when the widest part of your baby's head passes through the vaginal opening (crowning).  Your health care provider will closely monitor your pushing progress and your baby's progress through the vagina during stage 2.  Your  health care provider may massage the area of skin between your vaginal opening and anus (perineum) or apply warm compresses to your perineum. This helps it stretch as the baby's head starts to crown, which can help prevent perineal tearing. ? In some cases, an incision may be made in your perineum (episiotomy) to allow the baby to pass through the vaginal opening. An episiotomy helps to make the opening of the vagina larger to allow more room for the baby to fit through.  It is very important to breathe and focus so your health care provider can control the delivery of your baby's head. Your health care provider may have you decrease the intensity of your pushing, to help prevent perineal tearing.  After delivery of your baby's head, the shoulders and the rest of the body generally deliver very quickly and without difficulty.  Once your baby is delivered, the umbilical cord may be cut right away, or this may be delayed for 1-2 minutes, depending on your baby's health. This may vary among health care providers, hospitals, and birth centers.  If you and your baby are healthy enough, your baby may be placed on your chest or abdomen to help maintain the baby's temperature and to help you bond with each other. Some mothers and babies start breastfeeding at this time. Your health care team will dry your baby and help keep your baby warm during this time.  Your baby may need immediate care if he or she: ? Showed signs of distress during labor. ? Has a medical condition. ? Was born too early (prematurely). ? Had a bowel movement before birth (meconium). ? Shows signs of difficulty transitioning from being inside the uterus to being outside of the uterus. If you are planning to breastfeed, your health care team will help you begin a feeding. Stage 3  The third stage of labor starts immediately after the birth of your baby and ends after you deliver the placenta. The placenta is an organ that develops  during pregnancy to provide oxygen and nutrients to your baby in the womb.  Delivering the placenta may require some pushing, and you may have mild contractions. Breastfeeding can stimulate contractions to help you deliver the placenta.  After the placenta is delivered, your uterus should tighten (contract) and become firm. This helps to stop bleeding in your uterus. To help your uterus contract and to control bleeding, your health care provider may: ? Give you medicine by injection, through an IV tube, by mouth, or through your rectum (rectally). ? Massage your abdomen or perform a vaginal exam to remove any blood clots that are left in your uterus. ? Empty your bladder by placing a thin, flexible tube (catheter) into your bladder. ? Encourage   you to breastfeed your baby. After labor is over, you and your baby will be monitored closely to ensure that you are both healthy until you are ready to go home. Your health care team will teach you how to care for yourself and your baby. This information is not intended to replace advice given to you by your health care provider. Make sure you discuss any questions you have with your health care provider. Document Released: 09/09/2008 Document Revised: 06/20/2016 Document Reviewed: 12/16/2015 Elsevier Interactive Patient Education  2018 [employer] Inc.  

## 2017-07-20 NOTE — Progress Notes (Signed)
[facility]OB-[facility] doing well[facility]. [facility] by [employer]. Reviewed red flag symptoms and when to call. RTC x 1 week for ROB or sooner if neede.d

## 2017-07-24 ENCOUNTER — Other Ambulatory Visit: Payer: Self-pay | Admitting: Certified Nurse Midwife

## 2017-07-24 ENCOUNTER — Ambulatory Visit (INDEPENDENT_AMBULATORY_CARE_PROVIDER_SITE_OTHER): Payer: Medicaid Other | Admitting: Certified Nurse Midwife

## 2017-07-24 ENCOUNTER — Encounter: Payer: Self-pay | Admitting: Certified Nurse Midwife

## 2017-07-24 VITALS — BP 118/77 | HR 102 | Wt 113.0 lb

## 2017-07-24 DIAGNOSIS — Z3493 Encounter for supervision of normal pregnancy, unspecified, third trimester: Secondary | ICD-10-CM

## 2017-07-24 DIAGNOSIS — O480 Post-term pregnancy: Secondary | ICD-10-CM

## 2017-07-24 LAB — POCT URINALYSIS DIPSTICK
Bilirubin, UA: NEGATIVE
GLUCOSE UA: NEGATIVE
Ketones, UA: NEGATIVE
Leukocytes, UA: NEGATIVE
Nitrite, UA: NEGATIVE
PH UA: 6.5 (ref 5.0–8.0)
Protein, UA: NEGATIVE
RBC UA: NEGATIVE
SPEC GRAV UA: 1.02 (ref 1.010–1.025)
UROBILINOGEN UA: 0.2 U/dL

## 2017-07-24 NOTE — Patient Instructions (Signed)
Augmentation of Labor Augmentation of labor is when steps are taken to stimulate and strengthen uterine contractions during labor. This may be done when the contractions have slowed down or stopped, delaying progress of labor and delivery of the baby. Before beginning augmentation of labor, the health care provider will evaluate the condition of the mother and baby, the size and position of the baby, and the size of the birth canal. What are the reasons for labor augmentation? Reasons for augmentation of labor include:  Slow labor (prolonged first and second stage of labor) that has been associated with increased maternal risks, such as chorioamnionitis, postpartum hemorrhage, operative vaginal delivery, or third-degree or fourth-degree perineal lacerations.  Decreased average length of labor.  What methods are used for labor augmentation? Various methods may be used for augmentation of labor, including:  Oxytocin medicine. This medicine stimulates contractions. It is given through an IV access tube inserted into a vein.  Breaking the fluid-filled sac that surrounds the fetus (amniotic sac).  Stripping the membranes. The health care provider separates amniotic sac tissue from the cervix, causing the release of a hormone called progesterone that can stimulate uterine contractions.  Nipple stimulation.  Stimulation of certain pressure points on the ankles.  Manual or mechanical dilation of the cervix.  What are the risks associated with labor augmentation?  Overstimulation of the uterine contractions (continuous, prolonged, very strong contractions), causing fetal distress.  Increased chance of infection for the mother and baby.  Uterine tearing (rupture).  Breaking off (abruption) of the placenta.  Increased chance of cesarean, forceps, or vacuum delivery. What are some reasons for not doing labor augmentation? Augmentation of labor should not be done if:  The baby is too big for  the birth canal. This can be confirmed by ultrasonography.  The umbilical cord drops in front of the baby's head or breech part (prolapsed cord).  The mother had a previous cesarean delivery with a vertical incision in the uterus (or the kind of incision used is not known). High dose oxytocin should not be used if the mother had a previous cesarean delivery of any kind.  The mother had previous surgery on or into the uterus.  The mother has herpes.  The mother has cervical cancer.  The baby is lying sideways.  The mother's pelvis is deformed.  The mother is pregnant with more than two babies.  This information is not intended to replace advice given to you by your health care provider. Make sure you discuss any questions you have with your health care provider. Document Released: 05/26/2007 Document Revised: 05/14/2016 Document Reviewed: 06/30/2013 Elsevier Interactive Patient Education  2017 [employer] Biophysical Profile A biophysical profile is a non-invasive test that may be done during pregnancy to check that your developing baby (fetus) and your placenta are healthy. Your health care provider may recommend a biophysical profile if your pregnancy is at a higher risk for certain problems. A biophysical profile is usually done during the last 3 months of pregnancy (third trimester). A biophysical profile combines two tests to check the health of your baby. In one test, you will have a device strapped to your belly to measure your baby's heart rate. The other test involves using sound waves and a computer (ultrasound) to create an image of your baby inside your womb (uterus). Together, these tests tell your health care provider about the overall health of your baby. Tell a health care provider about:  Any allergies you have.  All medicines you  are taking, including vitamins, herbs, eye drops, creams, and over-the-counter medicines.  Any medical conditions you have.  Any concerns  you have about your pregnancy.  Any symptoms such as abdominal pain or contractions, nausea or vomiting, vaginal bleeding, leaking of amniotic fluid, decreased fetal movements, fever or infection, increased swelling, headaches, or visual disturbances.  How often you feel your baby move. What are the risks? There are no risks to you or your baby from a biophysical profile. What happens before the procedure? Ask your health care provider how to prepare.  You may need to drink fluids so that you have a full bladder for your ultrasound.  You may also need to eat before you arrive for the test. That makes your baby more active.  What happens during the procedure?  You will lie on your back on an exam table.  Your blood pressure may be monitored during the procedure.  A belt will be placed around your belly. The belt has a sensor to measure your baby's heart rate.  You may have to wear another belt and sensor to measure any muscle movements (contractions) in your uterus. During the ultrasound, a health care provider or technician will gently roll a handheld device (transducer) over your belly. This device sends signals to a computer that creates images of your baby.  Five areas of your baby's health and development will be checked during the biophysical profile: ? Heart rate. ? Breathing. ? Movement. ? Active muscle movement (muscle tone). ? The amount of fluid in your uterus (amniotic fluid). The procedure may vary among health care providers and hospitals. What happens after the procedure?  Your health care provider will discuss your results with you. The results of a biophysical profile are scored in a range of 0 to 10. Each area that is evaluated is given a score of 0 or 2 points. If you get a score of 6 or less, you may need further testing, or your baby may need to be delivered early. A score of 8 to 10 with normal amniotic fluid levels is considered normal.  Unless you need  additional testing, you can go home right after the procedure and resume your normal activities. Summary  A biophysical profile is a non-invasive test that may be done during pregnancy to check that your developing baby (fetus) and your placenta are healthy.  A biophysical profile combines two tests: A test to measure your baby's heart rate and an ultrasound test to create an image of your baby in the womb.  Tell your health care provider about any concerns you have about your pregnancy or any pregnancy-related symptoms.  If you get a score of 6 or less, you may need further testing, or your baby may need to be delivered early. A score of 8 to 10 with normal amniotic fluid levels is considered normal. This information is not intended to replace advice given to you by your health care provider. Make sure you discuss any questions you have with your health care provider. Document Released: 11/21/2002 Document Revised: 01/02/2017 Document Reviewed: 01/02/2017 Elsevier Interactive Patient Education  [employer].

## 2017-07-24 NOTE — Progress Notes (Signed)
Pt is here for a routine OB visit. 

## 2017-07-25 NOTE — Progress Notes (Signed)
ROB-Pt doing well, expressed fears about labor and birth. [facility]. Reviewed red flag symptoms and when to call. RTC x 3-4 day for Growth US, BPP,  and ROB or sooner if needed.

## 2017-07-28 ENCOUNTER — Encounter: Payer: Self-pay | Admitting: Certified Nurse Midwife

## 2017-07-28 ENCOUNTER — Ambulatory Visit (INDEPENDENT_AMBULATORY_CARE_PROVIDER_SITE_OTHER): Payer: Medicaid Other

## 2017-07-28 ENCOUNTER — Ambulatory Visit (INDEPENDENT_AMBULATORY_CARE_PROVIDER_SITE_OTHER): Payer: Medicaid Other | Admitting: Certified Nurse Midwife

## 2017-07-28 VITALS — BP 111/76 | HR 86 | Wt 113.4 lb

## 2017-07-28 DIAGNOSIS — O480 Post-term pregnancy: Secondary | ICD-10-CM | POA: Diagnosis not present

## 2017-07-28 DIAGNOSIS — Z3493 Encounter for supervision of normal pregnancy, unspecified, third trimester: Secondary | ICD-10-CM | POA: Diagnosis not present

## 2017-07-28 LAB — POCT URINALYSIS DIPSTICK
BILIRUBIN UA: NEGATIVE
Glucose, UA: NEGATIVE
Ketones, UA: NEGATIVE
LEUKOCYTES UA: NEGATIVE
NITRITE UA: NEGATIVE
PH UA: 7 (ref 5.0–8.0)
PROTEIN UA: NEGATIVE
RBC UA: NEGATIVE
Spec Grav, UA: 1.015 (ref 1.010–1.025)
UROBILINOGEN UA: 0.2 U/dL

## 2017-07-28 NOTE — Progress Notes (Signed)
ROB and ultrasound today. Ultrasound shows  Grade 3++ placenta for heavily calcified appearance and multiple placental lakes visualized. BPP 8/8. Induction scheduled 8/16 @ 0800. Fetal kick counts reviewed. Labor precautions discussed. [facility].   Doreene BurkeAnnie Ravleen Ries, CNM   ULTRASOUND REPORT  Location: [facility] Date of Service: 07/28/17  Indications: BPP, EFW and AFI for Post Dates Findings:  Singleton intrauterine pregnancy is visualized with FHR at 145 BPM. Biometrics give an (U/S) Gestational age of [redacted] weeks and an (U/S) EDD of 08/11/17; this correlates with the clinically established EDD of 07/24/17 ( Within the 21 day difference for 3rd trimester growth).  Fetal presentation is vertex, spine anterior.  EFW: 3319 grams ( 7 lbs. 5 oz) 38th percentile for growth, Williams. Placenta: Anterior and very calcified with large placental lakes seen, grade 3++. AFI: 8.1 cm which is adequate for gestational age. It is noted that the amniotic fluid is very cloudy.  Anatomic survey of the fetal stomach, bladder and kidneys appear WNL. Gender - Female.   BPP: 8/8 for good fetal breathing movements, gross body movements, tone, and AFI.    Impression: 1. 38 week Viable Singleton Intrauterine pregnancy by U/S. 2. (U/S) EDD is consistent with Clinically established (LMP) EDD of 07/24/17. 3. EFW: 3319 grams ( 7 lbs. 5 oz), 38th percentile for growth, Williams. 4. AFI: 8.1 cm adequate, but very cloudy appearing. 5. BPP: 8/8. 6. Grade 3++ placenta for heavily calcified appearance and multiple placental lakes visualized.  Recommendations: 1.Clinical correlation with the patient's History and Physical Exam. 2. Patient has a return appointment at 1:30 pm to see Doreene BurkeAnnie Branae Crail, CNM.  Revonda Humphreyeresa Elliott, RDMS, RVT

## 2017-07-28 NOTE — Patient Instructions (Signed)

## 2017-07-30 ENCOUNTER — Inpatient Hospital Stay
Admission: EM | Admit: 2017-07-30 | Discharge: 2017-08-01 | DRG: 775 | Disposition: A | Discharge status: Home / Self Care | Payer: Medicaid Other | Source: Ambulatory Visit | Attending: Obstetrics and Gynecology | Admitting: Obstetrics and Gynecology

## 2017-07-30 ENCOUNTER — Inpatient Hospital Stay: Payer: Medicaid Other | Admitting: Registered Nurse

## 2017-07-30 DIAGNOSIS — Z3A40 40 weeks gestation of pregnancy: Secondary | ICD-10-CM | POA: Diagnosis not present

## 2017-07-30 DIAGNOSIS — O9902 Anemia complicating childbirth: Secondary | ICD-10-CM | POA: Diagnosis present

## 2017-07-30 DIAGNOSIS — O480 Post-term pregnancy: Principal | ICD-10-CM | POA: Diagnosis present

## 2017-07-30 DIAGNOSIS — D649 Anemia, unspecified: Secondary | ICD-10-CM | POA: Diagnosis present

## 2017-07-30 LAB — CBC
HCT: 34.6 % — ABNORMAL LOW (ref 35.0–47.0)
Hemoglobin: 11.8 g/dL — ABNORMAL LOW (ref 12.0–16.0)
MCH: 30.2 pg (ref 26.0–34.0)
MCHC: 34.1 g/dL (ref 32.0–36.0)
MCV: 88.4 fL (ref 80.0–100.0)
PLATELETS: 222 10*3/uL (ref 150–440)
RBC: 3.91 MIL/uL (ref 3.80–5.20)
RDW: 19.5 % — AB (ref 11.5–14.5)
WBC: 9.8 10*3/uL (ref 3.6–11.0)

## 2017-07-30 LAB — CHLAMYDIA/NGC RT PCR (ARMC ONLY)
Chlamydia Tr: NOT DETECTED
N gonorrhoeae: NOT DETECTED

## 2017-07-30 LAB — TYPE AND SCREEN
ABO/RH(D): A POS
ANTIBODY SCREEN: NEGATIVE

## 2017-07-30 MED ORDER — FENTANYL CITRATE (PF) 100 MCG/2ML IJ SOLN: 50.0000 ug | INTRAMUSCULAR | Status: DC | PRN

## 2017-07-30 MED ORDER — ONDANSETRON HCL 4 MG/2ML IJ SOLN: 4.0000 mg | INTRAMUSCULAR | Status: DC | PRN

## 2017-07-30 MED ORDER — ONDANSETRON HCL 4 MG PO TABS: 4.0000 mg | ORAL_TABLET | ORAL | Status: DC | PRN

## 2017-07-30 MED ORDER — SENNOSIDES-DOCUSATE SODIUM 8.6-50 MG PO TABS
2.0000 | ORAL_TABLET | ORAL | Status: DC
  Administered 2017-07-31 – 2017-08-01 (×2): 2 via ORAL
  Filled 2017-07-30 (×2): qty 2

## 2017-07-30 MED ORDER — TERBUTALINE SULFATE 1 MG/ML IJ SOLN: 0.2500 mg | Freq: Once | INTRAMUSCULAR | Status: DC | PRN

## 2017-07-30 MED ORDER — IBUPROFEN 600 MG PO TABS
600.0000 mg | ORAL_TABLET | Freq: Four times a day (QID) | ORAL | Status: DC
  Administered 2017-07-30 – 2017-08-01 (×7): 600 mg via ORAL
  Filled 2017-07-30 (×6): qty 1

## 2017-07-30 MED ORDER — OXYTOCIN 40 UNITS IN LACTATED RINGERS INFUSION - SIMPLE MED: 2.5000 [IU]/h | INTRAVENOUS | Status: DC

## 2017-07-30 MED ORDER — DIPHENHYDRAMINE HCL 25 MG PO CAPS: 25.0000 mg | ORAL_CAPSULE | Freq: Four times a day (QID) | ORAL | Status: DC | PRN

## 2017-07-30 MED ORDER — OXYTOCIN BOLUS FROM INFUSION: 500.0000 mL | Freq: Once | INTRAVENOUS | Status: DC

## 2017-07-30 MED ORDER — LACTATED RINGERS IV SOLN
INTRAVENOUS | Status: DC
  Administered 2017-07-30 (×2): 1000 mL via INTRAVENOUS

## 2017-07-30 MED ORDER — LIDOCAINE-EPINEPHRINE (PF) 1.5 %-1:200000 IJ SOLN
INTRAMUSCULAR | Status: DC | PRN
  Administered 2017-07-30: 3 mL via PERINEURAL

## 2017-07-30 MED ORDER — BUPIVACAINE HCL (PF) 0.25 % IJ SOLN
INTRAMUSCULAR | Status: DC | PRN
  Administered 2017-07-30 (×2): 4 mL via EPIDURAL

## 2017-07-30 MED ORDER — LIDOCAINE HCL (PF) 1 % IJ SOLN: 30.0000 mL | INTRAMUSCULAR | Status: DC | PRN

## 2017-07-30 MED ORDER — COCONUT OIL OIL: 1.0000 "application " | TOPICAL_OIL | Status: DC | PRN

## 2017-07-30 MED ORDER — BENZOCAINE-MENTHOL 20-0.5 % EX AERO
1.0000 "application " | INHALATION_SPRAY | CUTANEOUS | Status: DC | PRN
  Administered 2017-07-31 – 2017-08-01 (×2): 1 via TOPICAL
  Filled 2017-07-30 (×2): qty 56

## 2017-07-30 MED ORDER — SIMETHICONE 80 MG PO CHEW: 80.0000 mg | CHEWABLE_TABLET | ORAL | Status: DC | PRN

## 2017-07-30 MED ORDER — WITCH HAZEL-GLYCERIN EX PADS: 1.0000 "application " | MEDICATED_PAD | CUTANEOUS | Status: DC | PRN

## 2017-07-30 MED ORDER — ACETAMINOPHEN 325 MG PO TABS: 650.0000 mg | ORAL_TABLET | ORAL | Status: DC | PRN

## 2017-07-30 MED ORDER — FENTANYL 2.5 MCG/ML W/ROPIVACAINE 0.15% IN NS 100 ML EPIDURAL (ARMC)
EPIDURAL | Status: DC | PRN
  Administered 2017-07-30: 12 mL/h via EPIDURAL

## 2017-07-30 MED ORDER — MISOPROSTOL 25 MCG QUARTER TABLET: 50.0000 ug | ORAL_TABLET | ORAL | Status: DC

## 2017-07-30 MED ORDER — LACTATED RINGERS IV SOLN
500.0000 mL | INTRAVENOUS | Status: DC | PRN
  Administered 2017-07-30: 500 mL via INTRAVENOUS

## 2017-07-30 MED ORDER — DIBUCAINE 1 % RE OINT
1.0000 "application " | TOPICAL_OINTMENT | RECTAL | Status: DC | PRN
  Administered 2017-07-31: 1 via RECTAL
  Filled 2017-07-30: qty 28

## 2017-07-30 MED ORDER — SOD CITRATE-CITRIC ACID 500-334 MG/5ML PO SOLN: 30.0000 mL | ORAL | Status: DC | PRN

## 2017-07-30 MED ORDER — ONDANSETRON HCL 4 MG/2ML IJ SOLN: 4.0000 mg | Freq: Four times a day (QID) | INTRAMUSCULAR | Status: DC | PRN

## 2017-07-30 MED ORDER — OXYTOCIN 40 UNITS IN LACTATED RINGERS INFUSION - SIMPLE MED
1.0000 m[IU]/min | INTRAVENOUS | Status: DC
  Administered 2017-07-30: 2 m[IU]/min via INTRAVENOUS

## 2017-07-30 MED ORDER — PRENATAL MULTIVITAMIN CH
1.0000 | ORAL_TABLET | Freq: Every day | ORAL | Status: DC
  Administered 2017-07-31 – 2017-08-01 (×2): 1 via ORAL
  Filled 2017-07-30 (×2): qty 1

## 2017-07-30 MED ORDER — LIDOCAINE HCL (PF) 1 % IJ SOLN
INTRAMUSCULAR | Status: DC | PRN
  Administered 2017-07-30: 3 mL

## 2017-07-30 NOTE — Anesthesia Procedure Notes (Signed)
Epidural Patient location during procedure: OB Start time: 07/30/2017 11:45 AM End time: 07/30/2017 11:50 AM  Staffing Anesthesiologist: Priscella MannPENWARDEN, AMY Resident/CRNA: Stormy FabianURTIS, Jantz Main Performed: resident/CRNA   Preanesthetic Checklist Completed: patient identified, site marked, surgical consent, pre-op evaluation, timeout performed, IV checked, risks and benefits discussed and monitors and equipment checked  Epidural Patient position: sitting Prep: Betadine Patient monitoring: heart rate, continuous pulse ox and blood pressure Approach: midline Location: L4-L5 Injection technique: LOR saline  Needle:  Needle type: Tuohy  Needle gauge: 17 G Needle length: 9 cm and 9 Needle insertion depth: 4.5 cm Catheter type: closed end flexible Catheter size: 19 Gauge Catheter at skin depth: 10 cm Test dose: negative and 1.5% lidocaine with Epi 1:200 K  Assessment Sensory level: T10 Events: blood not aspirated, injection not painful, no injection resistance, negative IV test and no paresthesia  Additional Notes Pt. Evaluated and documentation done after procedure finished. Patient identified. Risks/Benefits/Options discussed with patient including but not limited to bleeding, infection, nerve damage, paralysis, failed block, incomplete pain control, headache, blood pressure changes, nausea, vomiting, reactions to medication both or allergic, itching and postpartum back pain. Confirmed with bedside nurse the patient's most recent platelet count. Confirmed with patient that they are not currently taking any anticoagulation, have any bleeding history or any family history of bleeding disorders. Patient expressed understanding and wished to proceed. All questions were answered. Sterile technique was used throughout the entire procedure. Please see nursing notes for vital signs. Test dose was given through epidural catheter and negative prior to continuing to dose epidural or start infusion. Warning  signs of high block given to the patient including shortness of breath, tingling/numbness in hands, complete motor block, or any concerning symptoms with instructions to call for help. Patient was given instructions on fall risk and not to get out of bed. All questions and concerns addressed with instructions to call with any issues or inadequate analgesia.   Patient tolerated the insertion well without immediate complications.Reason for block:procedure for pain

## 2017-07-30 NOTE — Progress Notes (Signed)
Genesis Judie PetitM Wallace CullensGray is a 21 y.o. G1P0 at 4055w6d by LMP admitted for induction of labor due to Post dates.  Subjective:  Denies pain since epidural placed. Objective: BP 110/72   Pulse 70   Temp 98.5 F (36.9 C) (Oral)   Resp 16   Ht 5\' 2"  (1.575 m)   Wt 113 lb (51.3 kg)   LMP 09/12/2016 (Approximate)   SpO2 100%   BMI 20.67 kg/m  No intake/output data recorded. No intake/output data recorded.  FHT:  FHR: 128 bpm, variability: moderate,  accelerations:  Present,  decelerations:  Absent since spontaneous variable but prolong decel that occurred 1 hour ago. Resolved with position change, O2, and foley insertion after 2 minutes. UC:   irregular, every 2-4 minutes, mild to palpation since pitocin stopped-IUPC placed SVE:  5/90/-1, AROM with small amount clear fluid Labs: Lab Results  Component Value Date   WBC 9.8 07/30/2017   HGB 11.8 (L) 07/30/2017   HCT 34.6 (L) 07/30/2017   MCV 88.4 07/30/2017   PLT 222 07/30/2017    Assessment / Plan: Induction of labor due to term with favorable cervix,  progressing well on pitocin  Labor: Progressing on Pitocin, will continue to increase then AROM Preeclampsia:  labs stable Fetal Wellbeing:  Category I Pain Control:  Epidural I/D:  n/a Anticipated MOD:  NSVD  Daaiel Starlin N Rilley Poulter 07/30/2017, 1:55 PM

## 2017-07-30 NOTE — Progress Notes (Signed)
Meagan Butler is a 21 y.o. G1P0 at 3671w6d by LMP admitted for induction of labor due to Post dates.  Subjective:  Denies pain Objective: BP 110/72   Pulse 70   Temp 98.5 F (36.9 C) (Oral)   Resp 16   Ht 5\' 2"  (1.575 m)   Wt 113 lb (51.3 kg)   LMP 09/12/2016 (Approximate)   SpO2 100%   BMI 20.67 kg/m  No intake/output data recorded. No intake/output data recorded.  FHT:  FHR: 121 bpm, variability: moderate,  accelerations:  Present,  decelerations:  Present spontaneous prolonged decel down to 80s for 2 minutes, slowly resolved with position changes & restarting O2.  UC:   irregular, every 2-3 minutes with 160 MVUs no pitocin SVE:  7cm per RN exam Labs: Lab Results  Component Value Date   WBC 9.8 07/30/2017   HGB 11.8 (L) 07/30/2017   HCT 34.6 (L) 07/30/2017   MCV 88.4 07/30/2017   PLT 222 07/30/2017    Assessment / Plan: IOL progressing with fetal decels occasionally.  Labor: Progressing normally Preeclampsia:  labs stable Fetal Wellbeing:  Category II Pain Control:  Epidural I/D:  n/a Anticipated MOD:  NSVD  Meagan Butler 07/30/2017, 3:29 PM

## 2017-07-30 NOTE — H&P (Signed)
Obstetric History and Physical  Meagan Butler is a 21 y.o. G1P0 with IUP at 3566w6d presenting with orders for IOL. Patient states she has been having  none contractions, none vaginal bleeding, intact membranes, with active fetal movement.    [facility]  Pregnancy complications or risks:none  Prenatal labs and studies: ABO, Rh: A/Positive/-- (12/28 1527) Antibody: Negative (12/28 1527) Rubella: 2.48 (12/28 1527) RPR: Non Reactive (12/28 1527)  HBsAg: Negative (12/28 1527)  HIV:    OZH:YQMVHQIOGBS:Negative (07/18 1420) 1 hr Glucola  Elevated with normal 3 hour Genetic screening normal Anatomy US normal  Past Medical History:  Diagnosis Date  . Anemia   . Depression     Past Surgical History:  Procedure Laterality Date  . NO PAST SURGERIES      OB History  Gravida Para Term Preterm AB Living  1            SAB TAB Ectopic Multiple Live Births               # Outcome Date GA Lbr Len/2nd Weight Sex Delivery Anes PTL Lv  1 Current               Social History   Social History  . Marital status: Single    Spouse name: N/A  . Number of children: N/A  . Years of education: N/A   Social History Main Topics  . Smoking status: Never Smoker  . Smokeless tobacco: Never Used  . Alcohol use No  . Drug use: No  . Sexual activity: Yes    Birth control/ protection: None   Other Topics Concern  . Not on file   Social History Narrative  . No narrative on file    Family History  Problem Relation Age of Onset  . Adopted: Yes    Facility-Administered Medications Prior to Admission  Medication Dose Route Frequency Provider Last Rate Last Dose  . Tdap (BOOSTRIX) injection 0.5 mL  0.5 mL Intramuscular Once Doreene Burkehompson, Annie, CNM       Prescriptions Prior to Admission  Medication Sig Dispense Refill Last Dose  . Prenat w/o A FeCbnFeGlu-FA &B6 (CITRANATAL B-CALM) 20-1 & 25 (2) MG MISC Take 25 mg by mouth daily. 90 each 3 Taking    No Known  Allergies  Review of Systems: Negative except for what is mentioned in HPI.  Physical Exam: LMP 09/12/2016 (Approximate)  GENERAL: Well-developed, well-nourished female in no acute distress.  LUNGS: Clear to auscultation bilaterally.  HEART: Regular rate and rhythm. ABDOMEN: Soft, nontender, nondistended, gravid. EXTREMITIES: Nontender, no edema, 2+ distal pulses. Cervical Exam:   FHT:  Baseline rate 144 bpm   Variability moderate  Accelerations present   Decelerations none Contractions: none noted   Pertinent Labs/Studies:   No results found for this or any previous visit (from the past 24 hour(s)).  Assessment : Meagan Butler is a 21 y.o. G1P0 at 4366w6d being admitted for induction of labor.  Plan: Labor:   Induction per protocol FWB: Reassuring fetal heart tracing.  GBS negative Delivery plan: Hopeful for vaginal delivery  Alverta Caccamo, CNM [facility]

## 2017-07-30 NOTE — Anesthesia Preprocedure Evaluation (Signed)
Anesthesia Evaluation  Patient identified by MRN, date of birth, ID band Patient awake    Reviewed: Allergy & Precautions, H&P , NPO status , Patient's Chart, lab work & pertinent test results  History of Anesthesia Complications Negative for: history of anesthetic complications  Airway Mallampati: II  TM Distance: >3 FB Neck ROM: full    Dental no notable dental hx.    Pulmonary neg pulmonary ROS,    Pulmonary exam normal        Cardiovascular negative cardio ROS Normal cardiovascular exam     Neuro/Psych PSYCHIATRIC DISORDERS Depression/Anxietynegative neurological ROS     GI/Hepatic negative GI ROS, Neg liver ROS,   Endo/Other  negative endocrine ROS  Renal/GU negative Renal ROS  negative genitourinary   Musculoskeletal   Abdominal   Peds  Hematology  (+) anemia ,   Anesthesia Other Findings   Reproductive/Obstetrics (+) Pregnancy                             Anesthesia Physical Anesthesia Plan  ASA: II  Anesthesia Plan: Epidural   Post-op Pain Management:    Induction:   PONV Risk Score and Plan:   Airway Management Planned:   Additional Equipment:   Intra-op Plan:   Post-operative Plan:   Informed Consent: I have reviewed the patients History and Physical, chart, labs and discussed the procedure including the risks, benefits and alternatives for the proposed anesthesia with the patient or authorized representative who has indicated his/her understanding and acceptance.     Plan Discussed with: Anesthesiologist  Anesthesia Plan Comments:         Anesthesia Quick Evaluation

## 2017-07-31 LAB — CBC
HCT: 30.7 % — ABNORMAL LOW (ref 35.0–47.0)
Hemoglobin: 10.5 g/dL — ABNORMAL LOW (ref 12.0–16.0)
MCH: 30.2 pg (ref 26.0–34.0)
MCHC: 34.1 g/dL (ref 32.0–36.0)
MCV: 88.4 fL (ref 80.0–100.0)
PLATELETS: 207 10*3/uL (ref 150–440)
RBC: 3.47 MIL/uL — ABNORMAL LOW (ref 3.80–5.20)
RDW: 19.7 % — AB (ref 11.5–14.5)
WBC: 16.9 10*3/uL — AB (ref 3.6–11.0)

## 2017-07-31 LAB — RPR: RPR: NONREACTIVE

## 2017-07-31 NOTE — Progress Notes (Signed)
Post Partum Day 1 Subjective: up ad lib, voiding and c/o perineal laceration pain  Objective: Blood pressure 112/72, pulse 70, temperature 97.8 F (36.6 C), temperature source Oral, resp. rate 18, height 5\' 2"  (1.575 m), weight 113 lb (51.3 kg), last menstrual period 09/12/2016, SpO2 100 %, unknown if currently breastfeeding.  Physical Exam:  General: alert, cooperative and appears stated age Lochia: appropriate Uterine Fundus: firm DVT Evaluation: No evidence of DVT seen on physical exam. Negative Homan's sign.   Recent Labs  07/30/17 0845 07/31/17 0512  HGB 11.8* 10.5*  HCT 34.6* 30.7*    Assessment/Plan: Plan for discharge tomorrow Infant feeding  Bottle;    LOS: 1 day   Meagan Butler 07/31/2017, 8:16 AM

## 2017-07-31 NOTE — Anesthesia Postprocedure Evaluation (Signed)
Anesthesia Post Note  Patient: Meagan Butler  Procedure(s) Performed: * No procedures listed *  Patient location during evaluation: Mother Baby Anesthesia Type: Epidural Level of consciousness: awake, awake and alert and oriented Pain management: pain level controlled Vital Signs Assessment: post-procedure vital signs reviewed and stable Respiratory status: spontaneous breathing, respiratory function stable and nonlabored ventilation Cardiovascular status: blood pressure returned to baseline and stable Postop Assessment: no headache and no backache Anesthetic complications: no     Last Vitals:  Vitals:   07/31/17 0102 07/31/17 0411  BP: (!) 114/57 119/71  Pulse: 79 69  Resp: 18 20  Temp: 37.1 C (!) 36.3 C  SpO2: 97% 98%    Last Pain:  Vitals:   07/31/17 0411  TempSrc: Oral  PainSc:                  [name]

## 2017-08-01 MED ORDER — NORGESTIMATE-ETH ESTRADIOL 0.25-35 MG-MCG PO TABS: 1.0000 | ORAL_TABLET | Freq: Every day | ORAL | 11 refills | Status: DC

## 2017-08-01 NOTE — Discharge Instructions (Signed)
Postpartum Care After Vaginal Delivery °The period of time right after you deliver your newborn is called the postpartum period. °What kind of medical care will I receive? °· You may continue to receive fluids and medicines through an IV tube inserted into one of your veins. °· If an incision was made near your vagina (episiotomy) or if you had some vaginal tearing during delivery, cold compresses may be placed on your episiotomy or your tear. This helps to reduce pain and swelling. °· You may be given a squirt bottle to use when you go to the bathroom. You may use this until you are comfortable wiping as usual. To use the squirt bottle, follow these steps: °? Before you urinate, fill the squirt bottle with warm water. Do not use hot water. °? After you urinate, while you are sitting on the toilet, use the squirt bottle to rinse the area around your urethra and vaginal opening. This rinses away any urine and blood. °? You may do this instead of wiping. As you start healing, you may use the squirt bottle before wiping yourself. Make sure to wipe gently. °? Fill the squirt bottle with clean water every time you use the bathroom. °· You will be given sanitary pads to wear. °How can I expect to feel? °· You may not feel the need to urinate for several hours after delivery. °· You will have some soreness and pain in your abdomen and vagina. °· If you are breastfeeding, you may have uterine contractions every time you breastfeed for up to several weeks postpartum. Uterine contractions help your uterus return to its normal size. °· It is normal to have vaginal bleeding (lochia) after delivery. The amount and appearance of lochia is often similar to a menstrual period in the first week after delivery. It will gradually decrease over the next few weeks to a dry, yellow-brown discharge. For most women, lochia stops completely by 6-8 weeks after delivery. Vaginal bleeding can vary from woman to woman. °· Within the first few  days after delivery, you may have breast engorgement. This is when your breasts feel heavy, full, and uncomfortable. Your breasts may also throb and feel hard, tightly stretched, warm, and tender. After this occurs, you may have milk leaking from your breasts. Your health care provider can help you relieve discomfort due to breast engorgement. Breast engorgement should go away within a few days. °· You may feel more sad or worried than normal due to hormonal changes after delivery. These feelings should not last more than a few days. If these feelings do not go away after several days, speak with your health care provider. °How should I care for myself? °· Tell your health care provider if you have pain or discomfort. °· Drink enough water to keep your urine clear or pale yellow. °· Wash your hands thoroughly with soap and water for at least 20 seconds after changing your sanitary pads, after using the toilet, and before holding or feeding your baby. °· If you are not breastfeeding, avoid touching your breasts a lot. Doing this can make your breasts produce more milk. °· If you become weak or lightheaded, or you feel like you might faint, ask for help before: °? Getting out of bed. °? Showering. °· Change your sanitary pads frequently. Watch for any changes in your flow, such as a sudden increase in volume, a change in color, the passing of large blood clots. If you pass a blood clot from your vagina, save it   to show to your health care provider. Do not flush blood clots down the toilet without having your health care provider look at them. °· Make sure that all your vaccinations are up to date. This can help protect you and your baby from getting certain diseases. You may need to have immunizations done before you leave the hospital. °· If desired, talk with your health care provider about methods of family planning or birth control (contraception). °How can I start bonding with my baby? °Spending as much time as  possible with your baby is very important. During this time, you and your baby can get to know each other and develop a bond. Having your baby stay with you in your room (rooming in) can give you time to get to know your baby. Rooming in can also help you become comfortable caring for your baby. Breastfeeding can also help you bond with your baby. °How can I plan for returning home with my baby? °· Make sure that you have a car seat installed in your vehicle. °? Your car seat should be checked by a certified car seat installer to make sure that it is installed safely. °? Make sure that your baby fits into the car seat safely. °· Ask your health care provider any questions you have about caring for yourself or your baby. Make sure that you are able to contact your health care provider with any questions after leaving the hospital. °This information is not intended to replace advice given to you by your health care provider. Make sure you discuss any questions you have with your health care provider. °Document Released: 09/28/2007 Document Revised: 05/05/2016 Document Reviewed: 11/05/2015 °Elsevier Interactive Patient Education © 2018 [employer]vier Inc[employer] ° °

## 2017-08-01 NOTE — Progress Notes (Signed)
Period of Purple Cry viewed by both parents and DVD given to them for discharge home.

## 2017-08-01 NOTE — Discharge Summary (Signed)
Obstetric Discharge Summary Reason for Admission: induction of labor Prenatal Procedures: NST and ultrasound Intrapartum Procedures: spontaneous vaginal delivery Postpartum Procedures: none Complications-Operative and Postpartum: 2nd degree perineal laceration  Delivery Note At 7:23 PM a viable and healthy female was delivered via Vaginal, Spontaneous Delivery (Presentation: OA;  ).  APGAR: 8, 9; weight 7 lb 0.2 oz (3180 g).    Lacerations: 2nd degree Suture Repair: 3.0 vicryl rapide Est. Blood Loss (mL): 150  Mom to postpartum.  Baby to Couplet care / Skin to Skin.  Londa Mackowski N Toinette Lackie 08/01/2017, 11:48 AM ,   .     H/H:  Lab Results  Component Value Date/Time   HGB 10.5 (L) 07/31/2017 05:12 AM   HGB 9.8 (L) 05/01/2017 09:57 AM   HCT 30.7 (L) 07/31/2017 05:12 AM   HCT 30.1 (L) 05/01/2017 09:57 AM    Discharge Diagnoses: Term Pregnancy-delivered  Discharge Information: Date: 08/01/2017 Activity: pelvic rest Diet: routine Baby feeding: plans to bottle feed Contraception: oral contraceptives (estrogen/progesterone) Medications: PNV Condition: stable Instructions: refer to practice specific booklet Discharge to: home   Khamarion Bjelland [place], CNM 08/01/2017,11:48 AM

## 2017-10-09 ENCOUNTER — Ambulatory Visit (INDEPENDENT_AMBULATORY_CARE_PROVIDER_SITE_OTHER): Payer: Medicaid Other | Admitting: Certified Nurse Midwife

## 2017-10-09 ENCOUNTER — Encounter: Payer: Self-pay | Admitting: Certified Nurse Midwife

## 2017-10-09 NOTE — Progress Notes (Signed)
Subjective:    Meagan Butler is a 21 y.o. 401P1001 Caucasian female who presents for a postpartum visit. She is 6 weeks postpartum following a spontaneous vaginal delivery at 40.6 gestational weeks. Anesthesia: epidural. I have fully reviewed the prenatal and intrapartum course. Postpartum course has been Normal. Baby's course has been normal. Baby is feeding by bottle feeding . Bleeding no bleeding. Bowel function is normal. Bladder function is normal. Patient is sexually active. Last sexual activity: one week ago with out a condom. Contraception method is condoms. Postpartum depression screening: negative. Score 7.Last pap- nothing on record. Pt states that she is unsure. Instructed to follow up in 4-6 months for pap smear.   The following portions of the patient's history were reviewed and updated as appropriate: allergies, current medications, past medical history, past surgical history and problem list.  Review of Systems Pertinent items are noted in HPI.   Vitals:   10/09/17 1014  BP: 111/71  Pulse: 71  Weight: 85 lb 9.6 oz (38.8 kg)  Height: 5\' 2"  (1.575 m)   Patient's last menstrual period was 09/14/2017 (approximate).  Objective:   General:  alert, cooperative and no distress   Breasts:  deferred, no complaints  Lungs: clear to auscultation bilaterally  Heart:  regular rate and rhythm  Abdomen: soft, nontender   Vulva: normal  Vagina: normal vagina  Cervix:  closed  Corpus: Well-involuted  Adnexa:  Non-palpable  Rectal Exam: no hemorrhoids        Assessment:   Postpartum exam 6 wks s/p SVD Bottle feeding Depression screening negative Contraception counseling - pt is undecided on IUD vs pill.   Plan:  : condoms. Contraceptive counseling on pill/IUD. Discussed benefits and risk as well as IUD procedure.Pt instructed no intercourse or strict condom use then return in 2 wks for urine pregnancy test. If test will write for pill/place IUD . Information given on IUD and  pills.  Follow up in: 2 weeks for birth control  or earlier if needed. Pap smear in 4-6 months.   Doreene BurkeAnnie Buford Bremer, CNM

## 2017-10-09 NOTE — Patient Instructions (Signed)
Preventive Care 18-39 Years, Female Preventive care refers to lifestyle choices and visits with your health care provider that can promote health and wellness. What does preventive care include?  A yearly physical exam. This is also called an annual well check.  Dental exams once or twice a year.  Routine eye exams. Ask your health care provider how often you should have your eyes checked.  Personal lifestyle choices, including: ? Daily care of your teeth and gums. ? Regular physical activity. ? Eating a healthy diet. ? Avoiding tobacco and drug use. ? Limiting alcohol use. ? Practicing safe sex. ? Taking vitamin and mineral supplements as recommended by your health care provider. What happens during an annual well check? The services and screenings done by your health care provider during your annual well check will depend on your age, overall health, lifestyle risk factors, and family history of disease. Counseling Your health care provider may ask you questions about your:  Alcohol use.  Tobacco use.  Drug use.  Emotional well-being.  Home and relationship well-being.  Sexual activity.  Eating habits.  Work and work Statistician.  Method of birth control.  Menstrual cycle.  Pregnancy history.  Screening You may have the following tests or measurements:  Height, weight, and BMI.  Diabetes screening. This is done by checking your blood sugar (glucose) after you have not eaten for a while (fasting).  Blood pressure.  Lipid and cholesterol levels. These may be checked every 5 years starting at age 66.  Skin check.  Hepatitis C blood test.  Hepatitis B blood test.  Sexually transmitted disease (STD) testing.  BRCA-related cancer screening. This may be done if you have a family history of breast, ovarian, tubal, or peritoneal cancers.  Pelvic exam and Pap test. This may be done every 3 years starting at age 40. Starting at age 59, this may be done every 5  years if you have a Pap test in combination with an HPV test.  Discuss your test results, treatment options, and if necessary, the need for more tests with your health care provider. Vaccines Your health care provider may recommend certain vaccines, such as:  Influenza vaccine. This is recommended every year.  Tetanus, diphtheria, and acellular pertussis (Tdap, Td) vaccine. You may need a Td booster every 10 years.  Varicella vaccine. You may need this if you have not been vaccinated.  HPV vaccine. If you are 69 or younger, you may need three doses over 6 months.  Measles, mumps, and rubella (MMR) vaccine. You may need at least one dose of MMR. You may also need a second dose.  Pneumococcal 13-valent conjugate (PCV13) vaccine. You may need this if you have certain conditions and were not previously vaccinated.  Pneumococcal polysaccharide (PPSV23) vaccine. You may need one or two doses if you smoke cigarettes or if you have certain conditions.  Meningococcal vaccine. One dose is recommended if you are age 27-21 years and a first-year college student living in a residence hall, or if you have one of several medical conditions. You may also need additional booster doses.  Hepatitis A vaccine. You may need this if you have certain conditions or if you travel or work in places where you may be exposed to hepatitis A.  Hepatitis B vaccine. You may need this if you have certain conditions or if you travel or work in places where you may be exposed to hepatitis B.  Haemophilus influenzae type b (Hib) vaccine. You may need this if  you have certain risk factors.  Talk to your health care provider about which screenings and vaccines you need and how often you need them. This information is not intended to replace advice given to you by your health care provider. Make sure you discuss any questions you have with your health care provider. Document Released: 01/27/2002 Document Revised: 08/20/2016  Document Reviewed: 10/02/2015 Elsevier Interactive Patient Education  2017 [employer].

## 2017-10-23 ENCOUNTER — Encounter: Payer: Self-pay | Admitting: Certified Nurse Midwife

## 2017-10-23 ENCOUNTER — Ambulatory Visit (INDEPENDENT_AMBULATORY_CARE_PROVIDER_SITE_OTHER): Payer: Medicaid Other | Admitting: Certified Nurse Midwife

## 2017-10-23 ENCOUNTER — Other Ambulatory Visit: Payer: Self-pay

## 2017-10-23 VITALS — BP 100/77 | HR 80 | Wt 82.3 lb

## 2017-10-23 DIAGNOSIS — Z30014 Encounter for initial prescription of intrauterine contraceptive device: Secondary | ICD-10-CM | POA: Diagnosis not present

## 2017-10-23 LAB — POCT URINE PREGNANCY: PREG TEST UR: NEGATIVE

## 2017-10-23 NOTE — Progress Notes (Signed)
  GYNECOLOGY OFFICE PROCEDURE NOTE  Meagan Butler is a 21 y.o. G1P1001 here for Mirena IUD insertion. No GYN concerns.  IUD Insertion Procedure Note Patient identified, informed consent performed, consent signed.   Discussed risks of irregular bleeding, cramping, infection, malpositioning or misplacement of the IUD outside the uterus which may require further procedure such as laparoscopy. Time out was performed.  Urine pregnancy test negative.  Speculum placed in the vagina.  Cervix visualized.  Cleaned with Betadine x 2.  Grasped anteriorly with a single tooth tenaculum.  Uterus sounded to 6 cm.  Mirena IUD placed per manufacturer's recommendations.  Strings trimmed to 3 cm. Tenaculum was removed, good hemostasis noted.  Patient tolerated procedure well.   Patient was given post-procedure instructions.  She was advised to have backup contraception for one week.  Patient was also asked to check IUD strings periodically and follow up in 4 weeks for IUD check.   Doreene BurkeAnnie Ricketta Butler, CNM

## 2017-10-23 NOTE — Patient Instructions (Signed)

## 2017-11-14 ENCOUNTER — Encounter: Payer: Self-pay | Admitting: Emergency Medicine

## 2017-11-14 ENCOUNTER — Emergency Department
Admission: EM | Admit: 2017-11-14 | Discharge: 2017-11-15 | Disposition: A | Discharge status: Home / Self Care | Payer: Self-pay | Attending: Emergency Medicine | Admitting: Emergency Medicine

## 2017-11-14 ENCOUNTER — Emergency Department: Payer: Self-pay

## 2017-11-14 DIAGNOSIS — R110 Nausea: Secondary | ICD-10-CM | POA: Insufficient documentation

## 2017-11-14 DIAGNOSIS — J111 Influenza due to unidentified influenza virus with other respiratory manifestations: Secondary | ICD-10-CM

## 2017-11-14 DIAGNOSIS — R69 Illness, unspecified: Secondary | ICD-10-CM

## 2017-11-14 DIAGNOSIS — R070 Pain in throat: Secondary | ICD-10-CM | POA: Insufficient documentation

## 2017-11-14 DIAGNOSIS — E860 Dehydration: Secondary | ICD-10-CM

## 2017-11-14 DIAGNOSIS — R509 Fever, unspecified: Secondary | ICD-10-CM | POA: Insufficient documentation

## 2017-11-14 LAB — URINALYSIS, COMPLETE (UACMP) WITH MICROSCOPIC
BACTERIA UA: NONE SEEN
Bilirubin Urine: NEGATIVE
Glucose, UA: NEGATIVE mg/dL
Ketones, ur: 20 mg/dL — AB
Leukocytes, UA: NEGATIVE
NITRITE: NEGATIVE
Protein, ur: NEGATIVE mg/dL
SPECIFIC GRAVITY, URINE: 1.018 (ref 1.005–1.030)
pH: 6 (ref 5.0–8.0)

## 2017-11-14 LAB — CBC WITH DIFFERENTIAL/PLATELET
BASOS PCT: 0 %
Basophils Absolute: 0 10*3/uL (ref 0–0.1)
Eosinophils Absolute: 0 10*3/uL (ref 0–0.7)
Eosinophils Relative: 0 %
HEMATOCRIT: 33.5 % — AB (ref 35.0–47.0)
Hemoglobin: 11.2 g/dL — ABNORMAL LOW (ref 12.0–16.0)
Lymphocytes Relative: 4 %
Lymphs Abs: 0.3 10*3/uL — ABNORMAL LOW (ref 1.0–3.6)
MCH: 30 pg (ref 26.0–34.0)
MCHC: 33.4 g/dL (ref 32.0–36.0)
MCV: 89.9 fL (ref 80.0–100.0)
MONO ABS: 0.9 10*3/uL (ref 0.2–0.9)
MONOS PCT: 11 %
NEUTROS ABS: 7 10*3/uL — AB (ref 1.4–6.5)
Neutrophils Relative %: 85 %
Platelets: 204 10*3/uL (ref 150–440)
RBC: 3.72 MIL/uL — ABNORMAL LOW (ref 3.80–5.20)
RDW: 14.4 % (ref 11.5–14.5)
WBC: 8.2 10*3/uL (ref 3.6–11.0)

## 2017-11-14 LAB — COMPREHENSIVE METABOLIC PANEL
ALK PHOS: 83 U/L (ref 38–126)
ALT: 17 U/L (ref 14–54)
ANION GAP: 9 (ref 5–15)
AST: 22 U/L (ref 15–41)
Albumin: 4.7 g/dL (ref 3.5–5.0)
BILIRUBIN TOTAL: 1.4 mg/dL — AB (ref 0.3–1.2)
BUN: 12 mg/dL (ref 6–20)
CALCIUM: 9.3 mg/dL (ref 8.9–10.3)
CO2: 23 mmol/L (ref 22–32)
CREATININE: 0.68 mg/dL (ref 0.44–1.00)
Chloride: 103 mmol/L (ref 101–111)
Glucose, Bld: 89 mg/dL (ref 65–99)
Potassium: 3.4 mmol/L — ABNORMAL LOW (ref 3.5–5.1)
SODIUM: 135 mmol/L (ref 135–145)
Total Protein: 7.9 g/dL (ref 6.5–8.1)

## 2017-11-14 LAB — INFLUENZA PANEL BY PCR (TYPE A & B)
INFLAPCR: NEGATIVE
INFLBPCR: NEGATIVE

## 2017-11-14 LAB — LACTIC ACID, PLASMA: Lactic Acid, Venous: 0.8 mmol/L (ref 0.5–1.9)

## 2017-11-14 MED ORDER — IBUPROFEN 200 MG PO TABS: 400.0000 mg | ORAL_TABLET | Freq: Four times a day (QID) | ORAL | 0 refills | Status: DC | PRN

## 2017-11-14 MED ORDER — ONDANSETRON HCL 4 MG/2ML IJ SOLN
4.0000 mg | Freq: Once | INTRAMUSCULAR | Status: AC
  Administered 2017-11-14: 4 mg via INTRAVENOUS
  Filled 2017-11-14: qty 2

## 2017-11-14 MED ORDER — SODIUM CHLORIDE 0.9 % IV BOLUS (SEPSIS)
1000.0000 mL | Freq: Once | INTRAVENOUS | Status: AC
  Administered 2017-11-14: 1000 mL via INTRAVENOUS

## 2017-11-14 MED ORDER — KETOROLAC TROMETHAMINE 30 MG/ML IJ SOLN
10.0000 mg | INTRAMUSCULAR | Status: AC
  Administered 2017-11-14: 9.9 mg via INTRAVENOUS
  Filled 2017-11-14: qty 1

## 2017-11-14 MED ORDER — ONDANSETRON 4 MG PO TBDP: 4.0000 mg | ORAL_TABLET | Freq: Three times a day (TID) | ORAL | 0 refills | Status: DC | PRN

## 2017-11-14 MED ORDER — ACETAMINOPHEN 325 MG PO TABS: 650.0000 mg | ORAL_TABLET | Freq: Four times a day (QID) | ORAL | 0 refills | Status: DC | PRN

## 2017-11-14 NOTE — ED Notes (Signed)
Pt masked in triage.

## 2017-11-14 NOTE — ED Triage Notes (Signed)
Pt with bodyaches, nausea, fever, sore throat. Pt with hot, flushed skin. Pt denies diarrhea. Pt appears sick and uncomfortable.

## 2017-11-15 LAB — PREGNANCY, URINE: Preg Test, Ur: NEGATIVE

## 2017-11-15 MED ORDER — AMOXICILLIN-POT CLAVULANATE 400-57 MG/5ML PO SUSR
ORAL | Status: AC
  Filled 2017-11-15: qty 1

## 2017-11-15 NOTE — Discharge Instructions (Signed)
Your chest xray and lab tests were all unremarkable today.  Your symptoms appear to be due to a viral illness.  Take ibuprofen and tylenol to control your fever and pain, and use Zofran as needed for nausea to help maintain good hydration. Follow up with primary care in the next 2-3 days for continued monitoring of your symptoms, or return to the ER if your condition worsens.

## 2017-11-15 NOTE — ED Provider Notes (Signed)
[facility] Emergency Department Provider Note  ____________________________________________  Time seen: Approximately 12:01 AM  I have reviewed the triage vital signs and the nursing notes.   HISTORY  Chief Complaint Fever    HPI Meagan Butler is a 21 y.o. female who complains of bodyaches nausea or fevers sore throat that all started earlier today. Denies any recent sick contacts or travel. No chest pain or shortness of breath. No vomiting or diarrhea or abdominal pain. No difficulty swallowing. No neck pain or vision change.She does have pain behind the eyes which is not sudden or severe in onset.  No dysuria frequency urgency. No irregular vaginal bleeding or discharge.   Past Medical History:  Diagnosis Date  . Anemia   . Depression      Patient Active Problem List   Diagnosis Date Noted  . BMI less than 19,adult 04/06/2017     Past Surgical History:  Procedure Laterality Date  . NO PAST SURGERIES       Prior to Admission medications   Medication Sig Start Date End Date Taking? Authorizing Provider  levonorgestrel (MIRENA, 52 MG,) 20 MCG/24HR IUD 1 each once by Intrauterine route.   Yes [provider]  acetaminophen (TYLENOL) 325 MG tablet Take 2 tablets (650 mg total) by mouth every 6 (six) hours as needed. 11/14/17   Sharman CheekStafford, Tyffani Foglesong, MD  ibuprofen (MOTRIN IB) 200 MG tablet Take 2 tablets (400 mg total) by mouth every 6 (six) hours as needed. 11/14/17   Sharman CheekStafford, Stokely Jeancharles, MD  ondansetron (ZOFRAN ODT) 4 MG disintegrating tablet Take 1 tablet (4 mg total) by mouth every 8 (eight) hours as needed for nausea or vomiting. 11/14/17   Sharman CheekStafford, Birdella Sippel, MD     Allergies Patient has no known allergies.   Family History  Adopted: Yes  Problem Relation Age of Onset  . Breast cancer Neg Hx   . Ovarian cancer Neg Hx   . Colon cancer Neg Hx   . Diabetes Neg Hx     Social History Social History   Tobacco Use  . Smoking  status: Never Smoker  . Smokeless tobacco: Never Used  Substance Use Topics  . Alcohol use: No  . Drug use: No    Review of Systems  Constitutional:   Positive fever.  ENT:   Positive sore throat. No rhinorrhea. Cardiovascular:   No chest pain or syncope. Respiratory:   No dyspnea or cough. Gastrointestinal:   Negative for abdominal pain, vomiting and diarrhea.  Musculoskeletal:   Negative for focal pain or swelling. Positive body aches All other systems reviewed and are negative except as documented above in ROS and HPI.  ____________________________________________   PHYSICAL EXAM:  VITAL SIGNS: ED Triage Vitals  Enc Vitals Group     BP 11/14/17 2036 109/72     Pulse Rate 11/14/17 2036 (!) 158     Resp 11/14/17 2036 (!) 22     Temp 11/14/17 2036 (!) 104.2 F (40.1 C)     Temp Source 11/14/17 2036 Oral     SpO2 11/14/17 2036 100 %     Weight 11/14/17 2037 83 lb (37.6 kg)     Height 11/14/17 2037 5\' 2"  (1.575 m)     Head Circumference --      Peak Flow --      Pain Score 11/14/17 2036 6     Pain Loc --      Pain Edu? --      Excl. in GC? --  Vital signs reviewed, nursing assessments reviewed.   Constitutional:   Alert and oriented. Well appearing and in no distress. Eyes:   No scleral icterus.  EOMI. No nystagmus. No conjunctival pallor. PERRL. ENT   Head:   Normocephalic and atraumatic.   Nose:   No congestion/rhinnorhea.    Mouth/Throat:   MMM, no pharyngeal erythema. No peritonsillar mass or swelling.    Neck:   No meningismus. Full ROM. No midline spinal tenderness Hematological/Lymphatic/Immunilogical:   Mild cervical lymphadenopathy. Cardiovascular:   Tachycardia heart rate 107 on the monitor. Symmetric bilateral radial and DP pulses.  No murmurs.  Respiratory:   Normal respiratory effort without tachypnea/retractions. Breath sounds are clear and equal bilaterally. No wheezes/rales/rhonchi. Gastrointestinal:   Soft and nontender. Non  distended. There is no CVA tenderness.  No rebound, rigidity, or guarding. Genitourinary:   deferred Musculoskeletal:   Normal range of motion in all extremities. No joint effusions.  No lower extremity tenderness.  No edema. Neurologic:   Normal speech and language.  Motor grossly intact. No gross focal neurologic deficits are appreciated.  Skin:    Skin is warm, dry and intact. No rash noted.  No petechiae, purpura, or bullae.  ____________________________________________    LABS (pertinent positives/negatives) (all labs ordered are listed, but only abnormal results are displayed) Labs Reviewed  COMPREHENSIVE METABOLIC PANEL - Abnormal; Notable for the following components:      Result Value   Potassium 3.4 (*)    Total Bilirubin 1.4 (*)    All other components within normal limits  CBC WITH DIFFERENTIAL/PLATELET - Abnormal; Notable for the following components:   RBC 3.72 (*)    Hemoglobin 11.2 (*)    HCT 33.5 (*)    Neutro Abs 7.0 (*)    Lymphs Abs 0.3 (*)    All other components within normal limits  URINALYSIS, COMPLETE (UACMP) WITH MICROSCOPIC - Abnormal; Notable for the following components:   Color, Urine YELLOW (*)    APPearance HAZY (*)    Hgb urine dipstick MODERATE (*)    Ketones, ur 20 (*)    Squamous Epithelial / LPF 6-30 (*)    All other components within normal limits  LACTIC ACID, PLASMA  INFLUENZA PANEL BY PCR (TYPE A & B)  POC URINE PREG, ED   ____________________________________________   EKG    ____________________________________________    RADIOLOGY  Dg Chest 2 View  Result Date: 11/14/2017 CLINICAL DATA:  Acute chest pain, fever and shortness of breath today EXAM: CHEST  2 VIEW COMPARISON:  12/23/2013 chest radiograph FINDINGS: The cardiomediastinal silhouette is unremarkable. There is no evidence of focal airspace disease, pulmonary edema, suspicious pulmonary nodule/mass, pleural effusion, or pneumothorax. No acute bony abnormalities are  identified. IMPRESSION: No active cardiopulmonary disease. Electronically Signed   By: Harmon Pier M.D.   On: 11/14/2017 22:08    ____________________________________________   PROCEDURES Procedures  ____________________________________________     CLINICAL IMPRESSION / ASSESSMENT AND PLAN / ED COURSE  Pertinent labs & imaging results that were available during my care of the patient were reviewed by me and considered in my medical decision making (see chart for details).   Patient presents with constellation of symptoms consistent with influenza-like illness. Mildly tachycardic in the treatment room consistent with degree of fever and probable dehydration. No other focal source. Presentation is not consistent with meningitis or encephalitis. Not septic. No suspicion at this time for STI or PID or TOA. No SSTI.  Workup negative. Patient feeling better after hydration  and anti-inflammatory medicine. Discharge home on NSAIDs and Zofran, return precautions. Not pregnant.      ____________________________________________   FINAL CLINICAL IMPRESSION(S) / ED DIAGNOSES    Final diagnoses:  Influenza-like illness  Dehydration      This SmartLink is deprecated. Use AVSMEDLIST instead to display the medication list for a patient.   Portions of this note were generated with dragon dictation software. Dictation errors may occur despite best attempts at proofreading.    Sharman CheekStafford, Quadasia Newsham, MD 11/15/17 Jorje [id number]

## 2017-11-20 ENCOUNTER — Ambulatory Visit (INDEPENDENT_AMBULATORY_CARE_PROVIDER_SITE_OTHER): Payer: Medicaid Other | Admitting: Certified Nurse Midwife

## 2017-11-20 VITALS — BP 106/74 | HR 71 | Wt 80.6 lb

## 2017-11-20 DIAGNOSIS — Z681 Body mass index (BMI) 19 or less, adult: Secondary | ICD-10-CM

## 2017-11-20 NOTE — Progress Notes (Signed)
GYNECOLOGY OFFICE PROGRESS NOTE  History:  21 y.o. G1P1001 here today for today for IUD string check; Mirena IUD was placed 10/23/17. No complaints about the IUD, no concerning side effects. She has had some bleeding that is now brownish discharge. It is starting to resolve.   The following portions of the patient's history were reviewed and updated as appropriate: allergies, current medications, past family history, past medical history, past social history, past surgical history and problem list. She has not had pap smear and was counseled on recommendations  Review of Systems:  Pertinent items are noted in HPI.   Body mass index is 14.74 kg/m.  Objective:  Physical Exam not currently breastfeeding. CONSTITUTIONAL: Well-developed, well-nourished female in no acute distress.  HENT:  Normocephalic, atraumatic. External right and left ear normal. Oropharynx is clear and moist EYES: Conjunctivae and EOM are normal.  No scleral icterus.  NECK: Normal range of motion, supple, no masses CARDIOVASCULAR: Normal heart rate noted RESPIRATORY: Effort and breath sounds normal, no problems with respiration noted ABDOMEN: Soft, no distention noted.   PELVIC: Normal appearing external genitalia; normal appearing vaginal mucosa and cervix.  IUD strings visualized, about 3 cm in length outside cervix.   Assessment & Plan:  Normal IUD check. Patient to keep IUD in place for five years; can come in for removal if she desires pregnancy within the next five years. Pt underweight with BMI 14.74. Nutrition referral placed. Routine preventative health maintenance measures emphasized. Follow up in February for annual exam and pap smear.   Doreene BurkeAnnie Ryle Buscemi, CNM

## 2017-11-20 NOTE — Progress Notes (Signed)
Pt is here for an IUD string check. 

## 2017-11-20 NOTE — Patient Instructions (Signed)

## 2017-12-02 ENCOUNTER — Encounter: Payer: Self-pay | Admitting: Dietician

## 2017-12-02 ENCOUNTER — Encounter: Payer: Medicaid Other | Attending: Certified Nurse Midwife | Admitting: Dietician

## 2017-12-02 VITALS — Ht 62.0 in | Wt 81.5 lb

## 2017-12-02 DIAGNOSIS — Z713 Dietary counseling and surveillance: Secondary | ICD-10-CM | POA: Insufficient documentation

## 2017-12-02 DIAGNOSIS — Z681 Body mass index (BMI) 19 or less, adult: Secondary | ICD-10-CM | POA: Insufficient documentation

## 2017-12-02 NOTE — Progress Notes (Signed)
Medical Nutrition Therapy: Visit start time: 1530  end time: 1600  Assessment:  Diagnosis: underweight Past medical history: ARFID, dehydration, eating disorder, malnutrition Psychosocial issues/ stress concerns: high stress life and admits to feelings of depression most days Preferred learning method:  . Auditory . Visual . Hands-on  Current weight: 81.5# *with coat  Height: 5\' 2"  Medications, supplements: IUD, tylenol prn  Progress and evaluation: Patient is underweight, desired weight gain, but is having difficulty gaining weight. Goal wt: 115#. States that ever since giving birth 4 months ago her appetite and ability to eat have declined even further. Reports frequent nausea and when trying to eat often gags. Also reports having no time to eat, and when she does have time she prefers to rest d/t being exhausted. Describes early satiety, may eat a couple of bites and feels full. She has not eaten or had anything to drink today. Previous interventions include: [facility], protein powders, appetite stimulant medication. She states that the appetite stimulant did not help increase appetite, and that she was not very consistent with the Ensure and protein powders though thinks they may have helped. She and her boyfriend buy groceries but when she brings it home to foods don't appeal to her and she "just doesn't want to eat it."   Physical activity: none at this time  Dietary Intake:  Usual eating pattern includes 1 meals and 0 snacks per day. Dining out frequency: 2 meals per week.  Breakfast: none Snack: none Lunch: none Snack: bag of chips Supper: does not always eat supper; frozen pizza, cooks or goes out occasionally. Went to [employer] last night Snack: no Beverages: water, soda - one each per day  Nutrition Care Education: Topics covered: ways to add calories to foods, liquid nutrition, consistency with nutrition interventions, eating on a schedule Basic  nutrition: basic food groups, appropriate nutrient balance, appropriate meal and snack schedule, general nutrition guidelines    Weight control: benefits of weight gain, identifying healthy weight, determining reasonable weight goal, behavioral changes for weight gain Advanced nutrition: dining out Other lifestyle changes: benefits of making changes, increasing motivation, readiness for change, identifying habits that need to change  Nutritional Diagnosis:  [place]-3.1 Underweight As related to lack of appetite/ ARFID.  As evidenced by BMI 14.91.  Intervention: Discussion as noted above. Patient is willing to try and drink milk or an Ensure supplement while she is feeding her son in order to take in more calories each day. She may also start trying to take additional bites of food and/or choosing higher calorie food & beverage options.  Education Materials given:  . Nutrition Therapy for Weight Gain  Learner/ who was taught:  . Patient  Level of understanding: . Partial understanding; needs review/ practice  Demonstrated degree of understanding via:   Teach back Learning barriers: . None  Willingness to learn/ readiness for change: . Hesitance, contemplating change  Monitoring and Evaluation:  Dietary intake, stress management, and body weight (gain)      follow up: prn

## 2018-01-22 ENCOUNTER — Other Ambulatory Visit: Payer: Self-pay

## 2018-01-22 ENCOUNTER — Emergency Department
Admission: EM | Admit: 2018-01-22 | Discharge: 2018-01-22 | Disposition: A | Discharge status: Home / Self Care | Payer: Medicaid Other | Attending: Emergency Medicine | Admitting: Emergency Medicine

## 2018-01-22 ENCOUNTER — Encounter: Payer: Self-pay | Admitting: Emergency Medicine

## 2018-01-22 DIAGNOSIS — R112 Nausea with vomiting, unspecified: Secondary | ICD-10-CM

## 2018-01-22 DIAGNOSIS — K529 Noninfective gastroenteritis and colitis, unspecified: Secondary | ICD-10-CM | POA: Insufficient documentation

## 2018-01-22 LAB — COMPREHENSIVE METABOLIC PANEL
ALBUMIN: 4.4 g/dL (ref 3.5–5.0)
ALT: 17 U/L (ref 14–54)
AST: 28 U/L (ref 15–41)
Alkaline Phosphatase: 64 U/L (ref 38–126)
Anion gap: 12 (ref 5–15)
BILIRUBIN TOTAL: 2.7 mg/dL — AB (ref 0.3–1.2)
BUN: 11 mg/dL (ref 6–20)
CO2: 23 mmol/L (ref 22–32)
CREATININE: 0.67 mg/dL (ref 0.44–1.00)
Calcium: 8.9 mg/dL (ref 8.9–10.3)
Chloride: 101 mmol/L (ref 101–111)
GFR calc Af Amer: >60 mL/min (ref >60)
GLUCOSE: 94 mg/dL (ref 65–99)
POTASSIUM: 3.2 mmol/L — AB (ref 3.5–5.1)
Sodium: 136 mmol/L (ref 135–145)
TOTAL PROTEIN: 7.7 g/dL (ref 6.5–8.1)

## 2018-01-22 LAB — CBC
HEMATOCRIT: 42.7 % (ref 35.0–47.0)
Hemoglobin: 14.2 g/dL (ref 12.0–16.0)
MCH: 29.8 pg (ref 26.0–34.0)
MCHC: 33.3 g/dL (ref 32.0–36.0)
MCV: 89.6 fL (ref 80.0–100.0)
PLATELETS: 195 10*3/uL (ref 150–440)
RBC: 4.76 MIL/uL (ref 3.80–5.20)
RDW: 16.1 % — AB (ref 11.5–14.5)
WBC: 6.8 10*3/uL (ref 3.6–11.0)

## 2018-01-22 LAB — URINALYSIS, COMPLETE (UACMP) WITH MICROSCOPIC
Bilirubin Urine: NEGATIVE
Glucose, UA: NEGATIVE mg/dL
Hgb urine dipstick: NEGATIVE
Ketones, ur: 20 mg/dL — AB
Leukocytes, UA: NEGATIVE
NITRITE: NEGATIVE
PH: 5 (ref 5.0–8.0)
Protein, ur: NEGATIVE mg/dL
RBC / HPF: NONE SEEN RBC/hpf (ref 0–5)
SPECIFIC GRAVITY, URINE: 1.023 (ref 1.005–1.030)

## 2018-01-22 LAB — LIPASE, BLOOD: Lipase: 26 U/L (ref 11–51)

## 2018-01-22 LAB — PREGNANCY, URINE: PREG TEST UR: NEGATIVE

## 2018-01-22 MED ORDER — SODIUM CHLORIDE 0.9 % IV BOLUS (SEPSIS)
1000.0000 mL | Freq: Once | INTRAVENOUS | Status: AC
  Administered 2018-01-22: 1000 mL via INTRAVENOUS

## 2018-01-22 MED ORDER — ACETAMINOPHEN 325 MG PO TABS
ORAL_TABLET | ORAL | Status: AC
  Filled 2018-01-22: qty 2

## 2018-01-22 MED ORDER — ONDANSETRON 4 MG PO TBDP: 4.0000 mg | ORAL_TABLET | Freq: Three times a day (TID) | ORAL | 0 refills | Status: DC | PRN

## 2018-01-22 MED ORDER — ACETAMINOPHEN 325 MG PO TABS
650.0000 mg | ORAL_TABLET | Freq: Once | ORAL | Status: AC
  Administered 2018-01-22: 650 mg via ORAL

## 2018-01-22 MED ORDER — ONDANSETRON 4 MG PO TBDP
4.0000 mg | ORAL_TABLET | Freq: Once | ORAL | Status: AC | PRN
  Administered 2018-01-22: 4 mg via ORAL
  Filled 2018-01-22: qty 1

## 2018-01-22 MED ORDER — ONDANSETRON HCL 4 MG/2ML IJ SOLN
4.0000 mg | Freq: Once | INTRAMUSCULAR | Status: AC
  Administered 2018-01-22: 4 mg via INTRAVENOUS
  Filled 2018-01-22: qty 2

## 2018-01-22 NOTE — Discharge Instructions (Signed)
Please take your Zofran as needed for nausea, as prescribed.  Please use Tylenol or ibuprofen as needed for fever discomfort, as written on the box.  Please drink plenty of fluids and obtain plenty of rest.  Return to the emergency department for development of any abdominal pain, unable to keep down fluids due to vomiting, or any other symptom personally concerning to yourself.

## 2018-01-22 NOTE — ED Provider Notes (Signed)
[facility] Emergency Department Provider Note  Time seen: 9:59 PM  I have reviewed the triage vital signs and the nursing notes.   HISTORY  Chief Complaint Fever; Nausea; and Emesis    HPI Meagan Butler is a 22 y.o. female with a past medical history of depression, presents to the emergency department for nausea vomiting.  According to the patient she awoke around 4:00 this morning with nausea vomiting.  Denies diarrhea.  States she was having abdominal cramping but denies any currently.  Low-grade fever at home currently 100.9.  Patient is tachycardic around 127.  States she has not been able to keep down any fluids today due to vomiting.  Overall appearance patient appears somewhat nauseated, very thin in appearance.   Past Medical History:  Diagnosis Date  . Anemia   . Depression     Patient Active Problem List   Diagnosis Date Noted  . BMI less than 19,adult 04/06/2017    Past Surgical History:  Procedure Laterality Date  . NO PAST SURGERIES      Prior to Admission medications   Medication Sig Start Date End Date Taking? Authorizing Provider  acetaminophen (TYLENOL) 325 MG tablet Take 2 tablets (650 mg total) by mouth every 6 (six) hours as needed. 11/14/17   Sharman Cheek, MD  levonorgestrel (MIRENA, 52 MG,) 20 MCG/24HR IUD 1 each once by Intrauterine route.    [provider]    No Known Allergies  Family History  Adopted: Yes  Problem Relation Age of Onset  . Breast cancer Neg Hx   . Ovarian cancer Neg Hx   . Colon cancer Neg Hx   . Diabetes Neg Hx     Social History Social History   Tobacco Use  . Smoking status: Never Smoker  . Smokeless tobacco: Never Used  Substance Use Topics  . Alcohol use: No  . Drug use: No    Review of Systems Constitutional: Low-grade fever Eyes: Negative for visual complaints ENT: Negative for recent illness/congestion Cardiovascular: Negative for chest pain. Respiratory: Negative  for shortness of breath. Gastrointestinal: Abdominal cramping earlier now gone.  Positive for nausea and vomiting.  Negative for diarrhea. Genitourinary: Negative for urinary compaints Musculoskeletal: Negative for musculoskeletal complaints Skin: Negative for skin complaints  Neurological: Negative for headache All other ROS negative  ____________________________________________   PHYSICAL EXAM:  VITAL SIGNS: ED Triage Vitals  Enc Vitals Group     BP 01/22/18 1959 122/76     Pulse Rate 01/22/18 1959 (!) 127     Resp 01/22/18 1959 20     Temp 01/22/18 1959 (!) 100.9 F (38.3 C)     Temp Source 01/22/18 1959 Oral     SpO2 01/22/18 1959 98 %     Weight 01/22/18 2002 80 lb (36.3 kg)     Height 01/22/18 2002 5\' 3"  (1.6 m)     Head Circumference --      Peak Flow --      Pain Score 01/22/18 2001 2     Pain Loc --      Pain Edu? --      Excl. in GC? --     Constitutional: Alert and oriented.  Very thin in appearance. Eyes: Normal exam ENT   Head: Normocephalic and atraumatic.   Mouth/Throat: Somewhat dry mucous membranes Cardiovascular: N regular rhythm rate around 120.  No obvious murmur. Respiratory: Normal respiratory effort without tachypnea nor retractions. Breath sounds are clear  Gastrointestinal: Soft and nontender.  No distention.  Musculoskeletal: Nontender with normal range of motion in all extremities. No lower extremity tenderness or edema. Neurologic:  Normal speech and language. No gross focal neurologic deficits  Skin:  Skin is warm, dry and intact.  Psychiatric: Mood and affect are normal.   ____________________________________________   INITIAL IMPRESSION / ASSESSMENT AND PLAN / ED COURSE  Pertinent labs & imaging results that were available during my care of the patient were reviewed by me and considered in my medical decision making (see chart for details).  Patient presents the emergency department for nausea vomiting beginning around 4:00  this morning.  Patient has a low-grade fever as well but a completely nontender abdominal exam.  Differential would include gastroenteritis, enteritis, gastritis, intra-abdominal pathology, infectious etiology.  Patient's labs are very reassuring with a normal white blood cell count, normal labs including LFTs and lipase.  Urinalysis shows ketones but no signs of infection.  Urine pregnancy test was not performed we will added onto the patient's lab work.  She remains tachycardic around 120 bpm.  We will recheck the patient's temperature dose IV fluids and monitor on a cardiac monitor.  Highly suspect gastroenteritis as the patient's cause of her symptoms.  Patient's heart rate has come down.  Temperature is down to 99.7.  We will discharge the patient home with Zofran.  I discussed the importance of continued oral hydration supportive care at home including Tylenol ibuprofen for fever or discomfort.  I also discussed return precautions for any significant vomiting, unable to keep down fluids, development of abdominal pain.  Patient agreeable to this plan of care.  ____________________________________________   FINAL CLINICAL IMPRESSION(S) / ED DIAGNOSES  Gastroenteritis    Minna AntisPaduchowski, Duglas Heier, MD 01/22/18 [phone]

## 2018-01-22 NOTE — ED Triage Notes (Signed)
Pt reports 'I'm sick" pt's mother reports pt has been having a fever as high as 99.66F and has been vomiting, reports theses symptoms started today reports she has not been able to eat or drink today. Pt actively vomiting in triage. Pt reports abdominal pain points to RUQ. Denies any urinary symptoms

## 2018-01-22 NOTE — ED Notes (Signed)
Pt states onset at 0400 today of nausea, vomiting, fever, headache and diarrhea. Pt states had sudden onset, no other known sick contacts. Pt states she would like something to drink. Mother at bedside. Pt is very thin. Pt's mother states "she's been heavin' and gaggin' all week".

## 2018-03-13 ENCOUNTER — Ambulatory Visit
Admission: EM | Admit: 2018-03-13 | Discharge: 2018-03-13 | Discharge status: Absent without leave | Payer: Medicaid Other

## 2018-03-14 ENCOUNTER — Other Ambulatory Visit: Payer: Self-pay

## 2018-03-14 ENCOUNTER — Ambulatory Visit
Admission: EM | Admit: 2018-03-14 | Discharge: 2018-03-14 | Disposition: A | Discharge status: Home / Self Care | Payer: Medicaid Other | Attending: Emergency Medicine | Admitting: Emergency Medicine

## 2018-03-14 DIAGNOSIS — J029 Acute pharyngitis, unspecified: Secondary | ICD-10-CM | POA: Insufficient documentation

## 2018-03-14 LAB — RAPID STREP SCREEN (MED CTR MEBANE ONLY): STREPTOCOCCUS, GROUP A SCREEN (DIRECT): NEGATIVE

## 2018-03-14 MED ORDER — IBUPROFEN 400 MG PO TABS: 400.0000 mg | ORAL_TABLET | Freq: Four times a day (QID) | ORAL | 0 refills | Status: DC | PRN

## 2018-03-14 NOTE — ED Provider Notes (Signed)
HPI  SUBJECTIVE:  Patient reports sore throat starting 2 days ago sx worse with swallowing.  Sx better with nothing. has been taking ibuprofen 400 mg w/ o relief.  No fever   No neck stiffness  mild Cough, no URI sxs + Myalgias + Headache No Rash     No Recent known Strep Exposure Mild epigastric abdominal pain yesterday, resolved. No reflux sxs No Allergy sxs  No Breathing difficulty, voice changes, No Drooling No Trismus No abx in past month.  No antipyretic in past 4-6 hrs  Past medical history negative for diabetes, hypertension, recurrent strep, mono.  LMP: Has Mirena.  Denies possibility of being pregnant.  PMD: None.   Past Medical History:  Diagnosis Date  . Anemia   . Depression     Past Surgical History:  Procedure Laterality Date  . NO PAST SURGERIES      Family History  Adopted: Yes  Problem Relation Age of Onset  . Breast cancer Neg Hx   . Ovarian cancer Neg Hx   . Colon cancer Neg Hx   . Diabetes Neg Hx     Social History   Tobacco Use  . Smoking status: Never Smoker  . Smokeless tobacco: Never Used  Substance Use Topics  . Alcohol use: No  . Drug use: No    No current facility-administered medications for this encounter.   Current Outpatient Medications:  .  ibuprofen (ADVIL,MOTRIN) 400 MG tablet, Take 1 tablet (400 mg total) by mouth every 6 (six) hours as needed., Disp: 30 tablet, Rfl: 0 .  levonorgestrel (MIRENA, 52 MG,) 20 MCG/24HR IUD, 1 each once by Intrauterine route., Disp: , Rfl:   No Known Allergies   ROS  As noted in HPI.   Physical Exam  BP 114/74 (BP Location: Left Arm)   Pulse 100   Temp 98.3 F (36.8 C) (Oral)   Resp 16   Ht 5\' 2"  (1.575 m)   Wt 84 lb (38.1 kg)   SpO2 99%   BMI 15.36 kg/m   Constitutional: Well developed, well nourished, no acute distress Eyes:  EOMI, conjunctiva normal bilaterally HENT: Normocephalic, atraumatic,mucus membranes moist.  - nasal congestion +  erythematous oropharynx -  enlarged tonsils  - exudates. Uvula midline.  No postnasal drip. Respiratory: Normal inspiratory effort Cardiovascular: Normal rate, no murmurs, rubs, gallops GI: nondistended, nontender. No appreciable splenomegaly skin: No rash, skin intact Lymph: + cervical LN  Musculoskeletal: no deformities Neurologic: Alert & oriented x 3, no focal neuro deficits Psychiatric: Speech and behavior appropriate.  ED Course   Medications - No data to display  Orders Placed This Encounter  Procedures  . Rapid strep screen    Standing Status:   Standing    Number of Occurrences:   1  . Culture, group A strep    Standing Status:   Standing    Number of Occurrences:   1    Results for orders placed or performed during the hospital encounter of 03/14/18 (from the past 24 hour(s))  Rapid strep screen     Status: None   Collection Time: 03/14/18  8:40 AM  Result Value Ref Range   Streptococcus, Group A Screen (Direct) NEGATIVE NEGATIVE   No results found.  ED Clinical Impression  Pharyngitis, unspecified etiology   ED Assessment/Plan   Rapid strep negative. Obtaining throat culture to guide antibiotic treatment. Discussed this with patientt. We'll contact them if culture is positive, and will call in Appropriate antibiotics. Patient home with  ibuprofen, Tylenol, Benadryl/Maalox mixture.. Patient to followup with PMD of choice when necessary, will refer to local primary care resources.   Meds ordered this encounter  Medications  . ibuprofen (ADVIL,MOTRIN) 400 MG tablet    Sig: Take 1 tablet (400 mg total) by mouth every 6 (six) hours as needed.    Dispense:  30 tablet    Refill:  0     *This clinic note was created using Scientist, clinical (histocompatibility and immunogenetics)Dragon dictation software. Therefore, there may be occasional mistakes despite careful proofreading.    Domenick GongMortenson, Rhanda Lemire, MD 03/14/18 [phone]

## 2018-03-14 NOTE — ED Triage Notes (Signed)
Pt with sore throat, body pain and headache. Pain 5/10. Sx for 2 days

## 2018-03-14 NOTE — Discharge Instructions (Addendum)
your rapid strep was negative today, so we have sent off a throat culture.  We will contact you and call in the appropriate antibiotics if your culture comes back positive for an infection requiring antibiotic treatment.  Give us a working phone number.   1 gram of Tylenol and 400 mg ibuprofen together 3-4 times a day as needed for pain.  Make sure you drink plenty of extra fluids.  Some people find salt water gargles and  Traditional Medicinal's "Throat Coat" tea helpful. Take 5 mL of liquid Benadryl and 5 mL of Maalox. Mix it together, and then hold it in your mouth for as long as you can and then swallow. You may do this 4 times a day.    Here is a list of primary care providers who are taking new patients:  Dr. Elizabeth Sauereanna Jones, Dr. Schuyler AmorWilliam Plonk [address] [place] [address] [phone]  [facility] [place] [address]  [place] [address]  [phone]  [facility] [address]  [place], [place] [address] [phone]  [facility] [place] [address]  [phone] [place], [place] [address]  Here are clinics/ other resources who will see you if you do not have insurance. Some have certain criteria that you must meet. Call them and find out what they are:  [facility]: [address] [place] [place], [place] [address] Phone: [phone] Hours: First and Third Saturdays of each Month, 9 a.m. - 1 p.m.  [facility]: [address], [place], [place] [address] Phone: [phone] Hours: Tuesday, 4 p.m. - 8 p.m. Thursday, 1 p.m. - 8 p.m. Wednesday, 9 a.m. - [facility] [place]  [facility] [address], [place], [place] [address] Phone: [phone] Pharmacy Phone Number: [phone] Dental Phone Number: [phone] [facility] Insurance Help: [phone]  Dental Hours: Monday - Thursday, 8 a.m. - 6 p.m.  [facility] [address] [place], [place] [address] Phone: [phone] Pharmacy  Phone Number: [phone] [facility] Help: [phone]  [facility] [address] [place], [place] [address] Phone: [phone] Pharmacy Phone Number: [phone] [facility] Insurance Help: [phone]  [facility] [address] [place], [place] [address] Phone: [phone] [facility] Insurance Help: [phone]   [facility] [address] [place], [place] [address] Phone: [phone]  Go to www.goodrx.com to look up your medications. This will give you a list of where you can find your prescriptions at the most affordable prices. Or ask the pharmacist what the cash price is, or if they have any other discount programs available to help make your medication more affordable. This can be less expensive than what you would pay with insurance.

## 2018-03-17 LAB — CULTURE, GROUP A STREP (THRC)

## 2018-04-29 ENCOUNTER — Ambulatory Visit: Payer: Medicaid Other

## 2018-05-06 ENCOUNTER — Ambulatory Visit: Payer: Medicaid Other | Admitting: Family Medicine

## 2018-05-06 VITALS — Temp 98.7°F | Wt 81.2 lb

## 2018-05-06 DIAGNOSIS — R636 Underweight: Secondary | ICD-10-CM

## 2018-05-06 MED ORDER — OMEPRAZOLE 20 MG PO CPDR: 20.0000 mg | DELAYED_RELEASE_CAPSULE | Freq: Every day | ORAL | 5 refills | Status: DC

## 2018-05-06 NOTE — Progress Notes (Signed)
   Subjective:    Patient ID: Meagan Butler, female    DOB: 04-Aug-1996, 22 y.o.   MRN: [id number]  HPI Here to get established. IUD for Nov 2018. No smoking,rare alcohol,no drugs. G1P1--gave birth to son August 2018. Review of Systems Negative.except she says she frequently gets nauseated when time to eat. Denies any eating disorder.    Objective:   Physical Exam  T-98.7  Weight 81lbs  BMI 14.85    HEENT normal Neck-normal Chest --clear Abdomen soft Ext no CCE Neuro--nonfocal     Assessment & Plan:  G1P1 Contraception IUD in place since Nov 2018. Underweight GERD/Gastritis/IBS? Check labs and try Omeprazole daily RTC 2-3 months.   Julieanne Manson MD

## 2018-05-07 LAB — COMPREHENSIVE METABOLIC PANEL
ALT: 18 IU/L (ref 0–32)
AST: 19 IU/L (ref 0–40)
Albumin/Globulin Ratio: 1.9 (ref 1.2–2.2)
Albumin: 5.2 g/dL (ref 3.5–5.5)
Alkaline Phosphatase: 70 IU/L (ref 39–117)
BUN/Creatinine Ratio: 33 — ABNORMAL HIGH (ref 9–23)
BUN: 19 mg/dL (ref 6–20)
Bilirubin Total: 0.8 mg/dL (ref 0.0–1.2)
CHLORIDE: 102 mmol/L (ref 96–106)
CO2: 24 mmol/L (ref 20–29)
Calcium: 9.9 mg/dL (ref 8.7–10.2)
Creatinine, Ser: 0.58 mg/dL (ref 0.57–1.00)
GFR calc non Af Amer: 131 mL/min/{1.73_m2} (ref >59)
GFR, EST AFRICAN AMERICAN: 151 mL/min/{1.73_m2} (ref >59)
GLUCOSE: 78 mg/dL (ref 65–99)
Globulin, Total: 2.8 g/dL (ref 1.5–4.5)
Potassium: 5 mmol/L (ref 3.5–5.2)
Sodium: 142 mmol/L (ref 134–144)
TOTAL PROTEIN: 8 g/dL (ref 6.0–8.5)

## 2018-05-07 LAB — CBC WITH DIFFERENTIAL/PLATELET
BASOS ABS: 0 10*3/uL (ref 0.0–0.2)
Basos: 0 %
EOS (ABSOLUTE): 0.1 10*3/uL (ref 0.0–0.4)
EOS: 1 %
HEMATOCRIT: 38.6 % (ref 34.0–46.6)
HEMOGLOBIN: 13.3 g/dL (ref 11.1–15.9)
IMMATURE GRANS (ABS): 0 10*3/uL (ref 0.0–0.1)
Immature Granulocytes: 0 %
LYMPHS ABS: 2.9 10*3/uL (ref 0.7–3.1)
LYMPHS: 36 %
MCH: 31.3 pg (ref 26.6–33.0)
MCHC: 34.5 g/dL (ref 31.5–35.7)
MCV: 91 fL (ref 79–97)
MONOCYTES: 7 %
Monocytes Absolute: 0.6 10*3/uL (ref 0.1–0.9)
NEUTROS ABS: 4.4 10*3/uL (ref 1.4–7.0)
Neutrophils: 56 %
PLATELETS: 276 10*3/uL (ref 150–450)
RBC: 4.25 x10E6/uL (ref 3.77–5.28)
RDW: 14.4 % (ref 12.3–15.4)
WBC: 8.1 10*3/uL (ref 3.4–10.8)

## 2018-05-07 LAB — TSH: TSH: 2.67 u[IU]/mL (ref 0.450–4.500)

## 2018-08-03 ENCOUNTER — Other Ambulatory Visit (INDEPENDENT_AMBULATORY_CARE_PROVIDER_SITE_OTHER): Payer: Self-pay

## 2018-08-03 ENCOUNTER — Other Ambulatory Visit: Payer: Self-pay | Admitting: Certified Nurse Midwife

## 2018-08-03 ENCOUNTER — Ambulatory Visit (INDEPENDENT_AMBULATORY_CARE_PROVIDER_SITE_OTHER): Payer: Medicaid Other | Admitting: Certified Nurse Midwife

## 2018-08-03 VITALS — BP 105/78 | HR 74 | Ht 62.0 in | Wt 82.3 lb

## 2018-08-03 DIAGNOSIS — Z30431 Encounter for routine checking of intrauterine contraceptive device: Secondary | ICD-10-CM

## 2018-08-03 DIAGNOSIS — R17 Unspecified jaundice: Secondary | ICD-10-CM

## 2018-08-03 DIAGNOSIS — N8301 Follicular cyst of right ovary: Secondary | ICD-10-CM

## 2018-08-03 NOTE — Progress Notes (Signed)
Pt is here wanting to discuss possibly changing BC from Mirena to something else.

## 2018-08-03 NOTE — Progress Notes (Signed)
Subjective:    Meagan Butler is a 22 y.o. female who presents for contraception counseling. The patient has no complaints today. The patient is sexually active. She had a mirena IUD placed 10/23/17 and has recently experienced yellowing of her eyes and not feeling well after intercourse. She had an episode of being dizzy. She denies any changes in vaginal bleeding, denies any other medical changes. She states that she like the IUD over all but is concerned about her recent symptoms.   Menstrual History: OB History    Gravida  1   Para  1   Term  1   Preterm      AB      Living  1     SAB      TAB      Ectopic      Multiple  0   Live Births  1           No LMP recorded. (Menstrual status: IUD).    The following portions of the patient's history were reviewed and updated as appropriate: allergies, current medications, past family history, past medical history, past social history, past surgical history and problem list.  Review of Systems Pertinent items are noted in HPI.   Objective:     U/S for IUD placement:      ULTRASOUND REPORT  Location: [facility] Date of Service:  08/03/2018  Indications: IUD check Findings:  The uterus measures 8.9 x 5.5 x 3.0 cm. Echo texture is homogeneous without evidence of focal masses. Prominent vessels are noted throughout the uterus.  The Endometrium measures 1.6 mm. The IUD appears to be in the correct location within the endometrium.  Right Ovary measures 3.5 x 2.4 x 2.2 cm and contains a dominant follicle measuring 1.8 x 1.7 x 1.5 cm. It is normal in appearance. Left Ovary measures 2.4 x 1.5 x 1.3 cm. It is normal appearance. Survey of the adnexa demonstrates no adnexal masses. There is no free fluid in the cul de sac.  Impression: 1. Anteverted uterus appears of normal size and contour. 2. Endometrium measures 1.6 mm. 3. IUD appears to be in the correct location. 4. Bilateral ovaries appear WNL.  Right  ovary contains dominant follicle. 5. Prominent uterine vessels.  Recommendations: 1.Clinical correlation with the patient's History and Physical Exam.  Kari BaarsJill Long, RDMS  Assessment:    22 y.o., IUD in correct position, possible pelvic congestion syndrome  Plan:    All questions answered.  Labs:CMP, bilirubin, bile acids today. Will follow up on results. Information given to pt on pelvic congestion syndrome.   I attest more than 50% of visit spent reviewing history, discussing IUD and possible problems, reviewing u/s results and discussing plan of care. Face to face time 10 min.   Doreene BurkeAnnie Aviraj Kentner, CNM

## 2018-08-03 NOTE — Patient Instructions (Signed)
Pelvic Pain, Female Pelvic pain is pain in your lower belly (abdomen), below your belly button and between your hips. The pain may start suddenly (acute), keep coming back (recurring), or last a long time (chronic). Pelvic pain that lasts longer than six months is considered chronic. There are many causes of pelvic pain. Sometimes the cause of your pelvic pain is not known. Follow these instructions at home:  Take over-the-counter and prescription medicines only as told by your doctor.  Rest as told by your doctor.  Do not have sex it if hurts.  Keep a journal of your pelvic pain. Write down: ? When the pain started. ? Where the pain is located. ? What seems to make the pain better or worse, such as food or your menstrual cycle. ? Any symptoms you have along with the pain.  Keep all follow-up visits as told by your doctor. This is important. Contact a doctor if:  Medicine does not help your pain.  Your pain comes back.  You have new symptoms.  You have unusual vaginal discharge or bleeding.  You have a fever or chills.  You are having a hard time pooping (constipation).  You have blood in your pee (urine) or poop (stool).  Your pee smells bad.  You feel weak or lightheaded. Get help right away if:  You have sudden pain that is very bad.  Your pain continues to get worse.  You have very bad pain and also have any of the following symptoms: ? A fever. ? Feeling stick to your stomach (nausea). ? Throwing up (vomiting). ? Being very sweaty.  You pass out (lose consciousness). This information is not intended to replace advice given to you by your health care provider. Make sure you discuss any questions you have with your health care provider. Document Released: 05/19/2008 Document Revised: 12/26/2015 Document Reviewed: 09/21/2015 Elsevier Interactive Patient Education  2018 [employer] Intrauterine Device Information An intrauterine device (IUD) is inserted into  your uterus to prevent pregnancy. There are two types of IUDs available:  Copper IUD-This type of IUD is wrapped in copper wire and is placed inside the uterus. Copper makes the uterus and fallopian tubes produce a fluid that kills sperm. The copper IUD can stay in place for 10 years.  Hormone IUD-This type of IUD contains the hormone progestin (synthetic progesterone). The hormone thickens the cervical mucus and prevents sperm from entering the uterus. It also thins the uterine lining to prevent implantation of a fertilized egg. The hormone can weaken or kill the sperm that get into the uterus. One type of hormone IUD can stay in place for 5 years, and another type can stay in place for 3 years.  Your health care provider will make sure you are a good candidate for a contraceptive IUD. Discuss with your health care provider the possible side effects. Advantages of an intrauterine device  IUDs are highly effective, reversible, long acting, and low maintenance.  There are no estrogen-related side effects.  An IUD can be used when breastfeeding.  IUDs are not associated with weight gain.  The copper IUD works immediately after insertion.  The hormone IUD works right away if inserted within 7 days of your period starting. You will need to use a backup method of birth control for 7 days if the hormone IUD is inserted at any other time in your cycle.  The copper IUD does not interfere with your female hormones.  The hormone IUD can make heavy menstrual  periods lighter and decrease cramping.  The hormone IUD can be used for 3 or 5 years.  The copper IUD can be used for 10 years. Disadvantages of an intrauterine device  The hormone IUD can be associated with irregular bleeding patterns.  The copper IUD can make your menstrual flow heavier and more painful.  You may experience cramping and vaginal bleeding after insertion. This information is not intended to replace advice given to you by  your health care provider. Make sure you discuss any questions you have with your health care provider. Document Released: 11/04/2004 Document Revised: 05/08/2016 Document Reviewed: 05/22/2013 Elsevier Interactive Patient Education  2017 [employer]

## 2018-08-05 LAB — COMPREHENSIVE METABOLIC PANEL
ALBUMIN: 4.7 g/dL (ref 3.5–5.5)
ALT: 15 IU/L (ref 0–32)
AST: 18 IU/L (ref 0–40)
Albumin/Globulin Ratio: 2 (ref 1.2–2.2)
Alkaline Phosphatase: 65 IU/L (ref 39–117)
BUN / CREAT RATIO: 20 (ref 9–23)
BUN: 14 mg/dL (ref 6–20)
Bilirubin Total: 1.2 mg/dL (ref 0.0–1.2)
CO2: 24 mmol/L (ref 20–29)
Calcium: 9.6 mg/dL (ref 8.7–10.2)
Chloride: 105 mmol/L (ref 96–106)
Creatinine, Ser: 0.71 mg/dL (ref 0.57–1.00)
GFR calc non Af Amer: 121 mL/min/{1.73_m2} (ref >59)
GFR, EST AFRICAN AMERICAN: 140 mL/min/{1.73_m2} (ref >59)
Globulin, Total: 2.4 g/dL (ref 1.5–4.5)
Glucose: 82 mg/dL (ref 65–99)
Potassium: 4.4 mmol/L (ref 3.5–5.2)
Sodium: 142 mmol/L (ref 134–144)
TOTAL PROTEIN: 7.1 g/dL (ref 6.0–8.5)

## 2018-08-05 LAB — BILE ACIDS, TOTAL: Bile Acids Total: 2.9 umol/L (ref 0.0–10.0)

## 2018-09-22 ENCOUNTER — Encounter: Payer: Medicaid Other | Admitting: Certified Nurse Midwife

## 2018-09-29 ENCOUNTER — Ambulatory Visit (INDEPENDENT_AMBULATORY_CARE_PROVIDER_SITE_OTHER): Payer: Self-pay | Admitting: Certified Nurse Midwife

## 2018-09-29 ENCOUNTER — Other Ambulatory Visit (HOSPITAL_COMMUNITY)
Admission: RE | Admit: 2018-09-29 | Discharge: 2018-09-29 | Disposition: A | Discharge status: Home / Self Care | Payer: Medicaid Other | Source: Ambulatory Visit | Attending: Certified Nurse Midwife | Admitting: Certified Nurse Midwife

## 2018-09-29 VITALS — BP 110/82 | HR 81 | Ht 62.0 in | Wt 85.4 lb

## 2018-09-29 DIAGNOSIS — N898 Other specified noninflammatory disorders of vagina: Secondary | ICD-10-CM | POA: Diagnosis present

## 2018-09-29 NOTE — Patient Instructions (Signed)

## 2018-09-29 NOTE — Progress Notes (Signed)
Body mass index is 15.63 kg/m.   GYN ENCOUNTER NOTE  Subjective:       Meagan Butler is a 22 y.o. G1P1001 female is here for gynecologic evaluation of the following issues:  1. She has concerns regarding her IUD. She states that she has increased vaginal discharge, that she feels sick after intercourse "weak", She denies pain with intercourse,  she states that she does not feel right. She denies any abdominal pain or new partner ( potential for STD).      Gynecologic History No LMP recorded. (Menstrual status: IUD). Contraception: IUD   Obstetric History OB History  Gravida Para Term Preterm AB Living  1 1 1     1   SAB TAB Ectopic Multiple Live Births        0 1    # Outcome Date GA Lbr Len/2nd Weight Sex Delivery Anes PTL Lv  1 Term 07/30/17 [redacted]w[redacted]d 05:20 / 01:33 7 lb 0.2 oz (3.18 kg) M Vag-Spont EPI  LIV    Past Medical History:  Diagnosis Date  . Anemia   . Depression     Past Surgical History:  Procedure Laterality Date  . NO PAST SURGERIES      Current Outpatient Medications on File Prior to Visit  Medication Sig Dispense Refill  . levonorgestrel (MIRENA, 52 MG,) 20 MCG/24HR IUD 1 each once by Intrauterine route.     No current facility-administered medications on file prior to visit.     No Known Allergies  Social History   Socioeconomic History  . Marital status: Single    Spouse name: Not on file  . Number of children: 1  . Years of education: Not on file  . Highest education level: Not on file  Occupational History  . Not on file  Social Needs  . Financial resource strain: Not on file  . Food insecurity:    Worry: Not on file    Inability: Not on file  . Transportation needs:    Medical: Not on file    Non-medical: Not on file  Tobacco Use  . Smoking status: Never Smoker  . Smokeless tobacco: Never Used  Substance and Sexual Activity  . Alcohol use: No  . Drug use: No  . Sexual activity: Yes    Birth control/protection: Condom  Lifestyle   . Physical activity:    Days per week: Not on file    Minutes per session: Not on file  . Stress: Not on file  Relationships  . Social connections:    Talks on phone: Not on file    Gets together: Not on file    Attends religious service: Not on file    Active member of club or organization: Not on file    Attends meetings of clubs or organizations: Not on file    Relationship status: Not on file  . Intimate partner violence:    Fear of current or ex partner: Not on file    Emotionally abused: Not on file    Physically abused: Not on file    Forced sexual activity: Not on file  Other Topics Concern  . Not on file  Social History Narrative  . Not on file    Family History  Adopted: Yes  Problem Relation Age of Onset  . Breast cancer Neg Hx   . Ovarian cancer Neg Hx   . Colon cancer Neg Hx   . Diabetes Neg Hx     The following portions of the  patient's history were reviewed and updated as appropriate: allergies, current medications, past family history, past medical history, past social history, past surgical history and problem list.  Review of Systems Review of Systems - Negative except for HPI Review of Systems - General ROS: negative for - chills, fatigue, fever, hot flashes, malaise or night sweats Hematological and Lymphatic ROS: negative for - bleeding problems or swollen lymph nodes Gastrointestinal ROS: negative for - abdominal pain, blood in stools, change in bowel habits and nausea/vomiting Musculoskeletal ROS: negative for - joint pain, muscle pain or muscular weakness Genito-Urinary ROS: negative for - change in menstrual cycle, dysmenorrhea, dyspareunia, dysuria, genital discharge, genital ulcers, hematuria, incontinence, irregular/heavy menses, nocturia or pelvic pain  Objective:   BP 110/82   Pulse 81   Ht 5\' 2"  (1.575 m)   Wt 85 lb 7 oz (38.8 kg)   BMI 15.63 kg/m  CONSTITUTIONAL: Well-developed, well-nourished female in no acute distress.  HENT:   Normocephalic, atraumatic.  NECK: Normal range of motion.  SKIN: Skin is warm and dry. No rash noted. Not diaphoretic. No erythema. No pallor. NEUROLGIC: Alert and oriented to person, place, and time.  PSYCHIATRIC: Normal mood and affect. Normal behavior. Normal judgment and thought content. CARDIOVASCULAR:Not Examined RESPIRATORY: Not Examined BREASTS: Not Examined ABDOMEN: Soft, non distended; Non tender.  No Organomegaly. PELVIC:  External Genitalia: Normal  BUS: Normal  Vagina: Normal  Cervix: Normal, strings present. MUSCULOSKELETAL: Normal range of motion. No tenderness.  No cyanosis, clubbing, or edema.  Assessment:   1. Vaginal discharge - Cervicovaginal ancillary only     Plan:   Birth control options reviewed including, patch, pill, ring, depo, and nexplanon. Discussed common side effects of each option. Pt verbalizes understanding. She is considering the pill.  Pt instructed to return for IUD removal if she so desires. Offered to remove IUD-pt declined at this time. Stating she will wait until the vaginal swab come back to decide . Follow up PRN. Will call with lab results. '  Doreene Burke, CNM

## 2018-10-01 LAB — CERVICOVAGINAL ANCILLARY ONLY
BACTERIAL VAGINITIS: NEGATIVE
CANDIDA VAGINITIS: NEGATIVE

## 2018-11-19 ENCOUNTER — Encounter: Payer: Self-pay | Admitting: Surgical

## 2020-11-20 ENCOUNTER — Other Ambulatory Visit: Payer: Self-pay

## 2020-11-20 ENCOUNTER — Ambulatory Visit (INDEPENDENT_AMBULATORY_CARE_PROVIDER_SITE_OTHER): Payer: Medicaid Other

## 2020-11-20 ENCOUNTER — Ambulatory Visit
Admission: EM | Admit: 2020-11-20 | Discharge: 2020-11-20 | Disposition: A | Discharge status: Home / Self Care | Payer: Medicaid Other | Attending: Physician Assistant | Admitting: Physician Assistant

## 2020-11-20 ENCOUNTER — Encounter: Payer: Self-pay | Admitting: Emergency Medicine

## 2020-11-20 DIAGNOSIS — M25561 Pain in right knee: Secondary | ICD-10-CM

## 2020-11-20 MED ORDER — NAPROXEN 375 MG PO TABS: 375.0000 mg | ORAL_TABLET | Freq: Two times a day (BID) | ORAL | 0 refills | Status: AC

## 2020-11-20 NOTE — ED Triage Notes (Signed)
Pt c/o right knee pain. Started about a week ago. No known injury. She states it hurts to bend it and walk on it.

## 2020-11-20 NOTE — Discharge Instructions (Signed)
X-rays are negative for any fracture or acute bony abnormality.  Wear knee brace.  Ice and elevate the knee.  You can also take the prescribed anti-inflammatory medicine and/or Tylenol for pain relief.  If you continue to have knee problems beyond the next couple of weeks your symptoms worsen please follow-up with orthopedics.  See contact information below.  You may have a condition requiring you to follow up with Orthopedics so please call one of the following office for appointment:   [facility] [address] [place], [place] [address] Phone: [phone]  [facility] [address], [place], [place] [address] Phone: [phone]

## 2020-11-20 NOTE — ED Provider Notes (Signed)
[facility]    CSN: [id number] Arrival date & time: 11/20/20  1739      History   Chief Complaint Chief Complaint  Patient presents with  . Knee Pain    right    HPI Meagan Butler is a 24 y.o. female presenting for right knee pain x1 week that worsened today while she was walking around at work.  Patient states she does not recall an injury.  She says the pain is severe and hurts to bend the knee and also put weight on the knee.  She states that her knee feels weak at times and gave out on her earlier.  She denies any associated numbness or tingling.  Patient states that she has had pain in her left knee before, for about a year, but has not had it checked out yet.  She states that she started to wear a knee brace on his knee and not improved the pain so she did not see anyone for it.  She denies any pain in the left knee currently.  Denies any history of significant knee injury and has never fractured her knees.  Denies any history of arthritis in her knees.  She says she has not taken anything for pain relief.  No other concerns.  HPI  Past Medical History:  Diagnosis Date  . Anemia   . Depression     Patient Active Problem List   Diagnosis Date Noted  . BMI less than 19,adult 04/06/2017  . Underweight 07/18/2013    Past Surgical History:  Procedure Laterality Date  . NO PAST SURGERIES      OB History    Gravida  1   Para  1   Term  1   Preterm      AB      Living  1     SAB      TAB      Ectopic      Multiple  0   Live Births  1            Home Medications    Prior to Admission medications   Medication Sig Start Date End Date Taking? Authorizing Provider  levonorgestrel (MIRENA, 52 MG,) 20 MCG/24HR IUD 1 each once by Intrauterine route.   Yes [provider]  naproxen (NAPROSYN) 375 MG tablet Take 1 tablet (375 mg total) by mouth 2 (two) times daily with a meal for 15 days. 11/20/20 12/05/20  Shirlee Latch, PA-C     Family History Family History  Adopted: Yes  Problem Relation Age of Onset  . Breast cancer Neg Hx   . Ovarian cancer Neg Hx   . Colon cancer Neg Hx   . Diabetes Neg Hx     Social History Social History   Tobacco Use  . Smoking status: Never Smoker  . Smokeless tobacco: Never Used  Vaping Use  . Vaping Use: Never used  Substance Use Topics  . Alcohol use: No  . Drug use: Yes    Types: Marijuana     Allergies   Patient has no known allergies.   Review of Systems Review of Systems  Constitutional: Negative for fatigue and fever.  Musculoskeletal: Positive for arthralgias and gait problem. Negative for joint swelling.  Skin: Negative for color change, rash and wound.  Neurological: Negative for weakness and numbness.     Physical Exam Triage Vital Signs ED Triage Vitals  Enc Vitals Group     BP  11/20/20 1752 136/88     Pulse Rate 11/20/20 1752 72     Resp 11/20/20 1752 18     Temp 11/20/20 1752 98.8 F (37.1 C)     Temp Source 11/20/20 1752 Oral     SpO2 11/20/20 1752 100 %     Weight 11/20/20 1750 85 lb 8.6 oz (38.8 kg)     Height 11/20/20 1750 5\' 2"  (1.575 m)     Head Circumference --      Peak Flow --      Pain Score 11/20/20 1750 8     Pain Loc --      Pain Edu? --      Excl. in GC? --    No data found.  Updated Vital Signs BP 136/88 (BP Location: Right Arm)   Pulse 72   Temp 98.8 F (37.1 C) (Oral)   Resp 18   Ht 5\' 2"  (1.575 m)   Wt 85 lb 8.6 oz (38.8 kg)   SpO2 100%   BMI 15.65 kg/m    Physical Exam Vitals and nursing note reviewed.  Constitutional:      General: She is not in acute distress.    Appearance: Normal appearance. She is not ill-appearing or toxic-appearing.     Comments: underweight  HENT:     Head: Normocephalic and atraumatic.  Eyes:     General: No scleral icterus.       Right eye: No discharge.        Left eye: No discharge.     Conjunctiva/sclera: Conjunctivae normal.  Cardiovascular:     Rate and  Rhythm: Normal rate and regular rhythm.     Pulses: Normal pulses.  Pulmonary:     Effort: Pulmonary effort is normal. No respiratory distress.  Musculoskeletal:     Cervical back: Neck supple.     Right knee: No swelling, effusion, erythema, ecchymosis or bony tenderness. Normal range of motion (painful full flexion). Tenderness present over the lateral joint line.  Skin:    General: Skin is dry.  Neurological:     General: No focal deficit present.     Mental Status: She is alert. Mental status is at baseline.     Motor: No weakness.     Gait: Gait abnormal.  Psychiatric:        Mood and Affect: Mood normal.        Behavior: Behavior normal.        Thought Content: Thought content normal.      UC Treatments / Results  Labs (all labs ordered are listed, but only abnormal results are displayed) Labs Reviewed - No data to display  EKG   Radiology DG Knee Complete 4 Views Right  Result Date: 11/20/2020 CLINICAL DATA:  Pain EXAM: RIGHT KNEE - COMPLETE 4+ VIEW COMPARISON:  None. FINDINGS: No evidence of fracture, dislocation, or joint effusion. No evidence of arthropathy or other focal bone abnormality. Soft tissues are unremarkable. IMPRESSION: Negative. Electronically Signed   By: M.D.   On: 11/20/2020 18:38    Procedures Procedures (including critical care time)  Medications Ordered in UC Medications - No data to display  Initial Impression / Assessment and Plan / UC Course  I have reviewed the triage vital signs and the nursing notes.  Pertinent labs & imaging results that were available during my care of the patient were reviewed by me and considered in my medical decision making (see chart for details).    24 year old female  presenting for a traumatic knee pain x1 week with pain worsening today.  X-ray of knee is within normal limits.  Discussed results with patient.  Patient given knee brace and advised RICE and NSAID/Tylenol for pain relief.  Advised  to follow-up with orthopedics for further work-up she is tolerating knee pain after another 2 weeks.  Advised her she may want to follow-up with orthopedics anyway so she has had left-sided knee pain for a year.  ED precautions discussed.  Final Clinical Impressions(s) / UC Diagnoses   Final diagnoses:  Acute pain of right knee     Discharge Instructions     X-rays are negative for any fracture or acute bony abnormality.  Wear knee brace.  Ice and elevate the knee.  You can also take the prescribed anti-inflammatory medicine and/or Tylenol for pain relief.  If you continue to have knee problems beyond the next couple of weeks your symptoms worsen please follow-up with orthopedics.  See contact information below.  You may have a condition requiring you to follow up with Orthopedics so please call one of the following office for appointment:   [facility] [address] [place], [place] [address] Phone: [phone]  [facility] [address], [place], [place] [address] Phone: [phone]     ED Prescriptions    Medication Sig Dispense Auth. Provider   naproxen (NAPROSYN) 375 MG tablet Take 1 tablet (375 mg total) by mouth 2 (two) times daily with a meal for 15 days. 30 tablet Gareth Morgan     PDMP not reviewed this encounter.   Shirlee Latch, PA-C 11/20/20 1943

## 2021-05-31 IMAGING — CR DG KNEE COMPLETE 4+V*R*
4 series · 4 of 4 positions shown · non-contrast
Comparison: None.

CLINICAL DATA: Pain

EXAM:
RIGHT KNEE - COMPLETE 4+ VIEW

[knee ap]
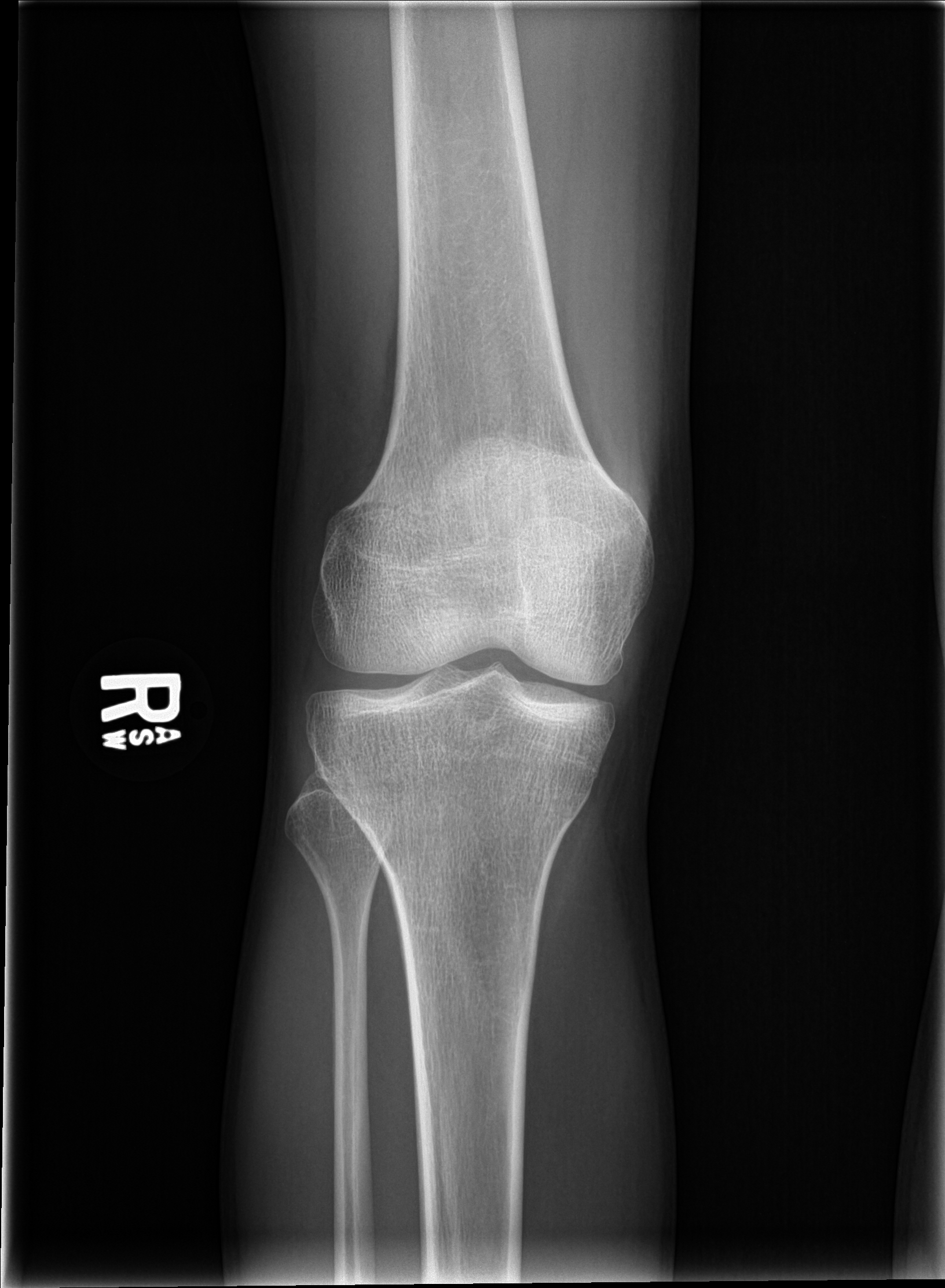

[knee lat]
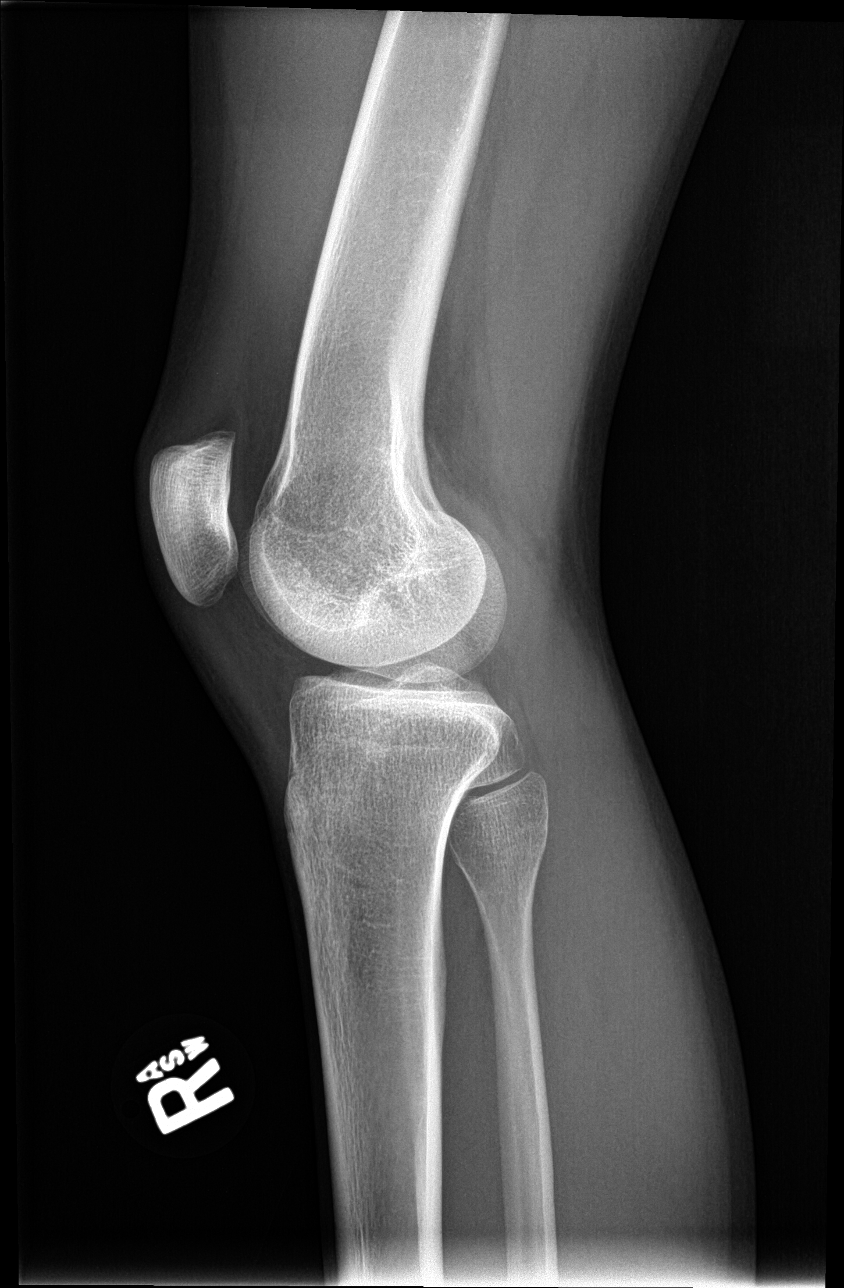

[knee obl (1 of 2)]
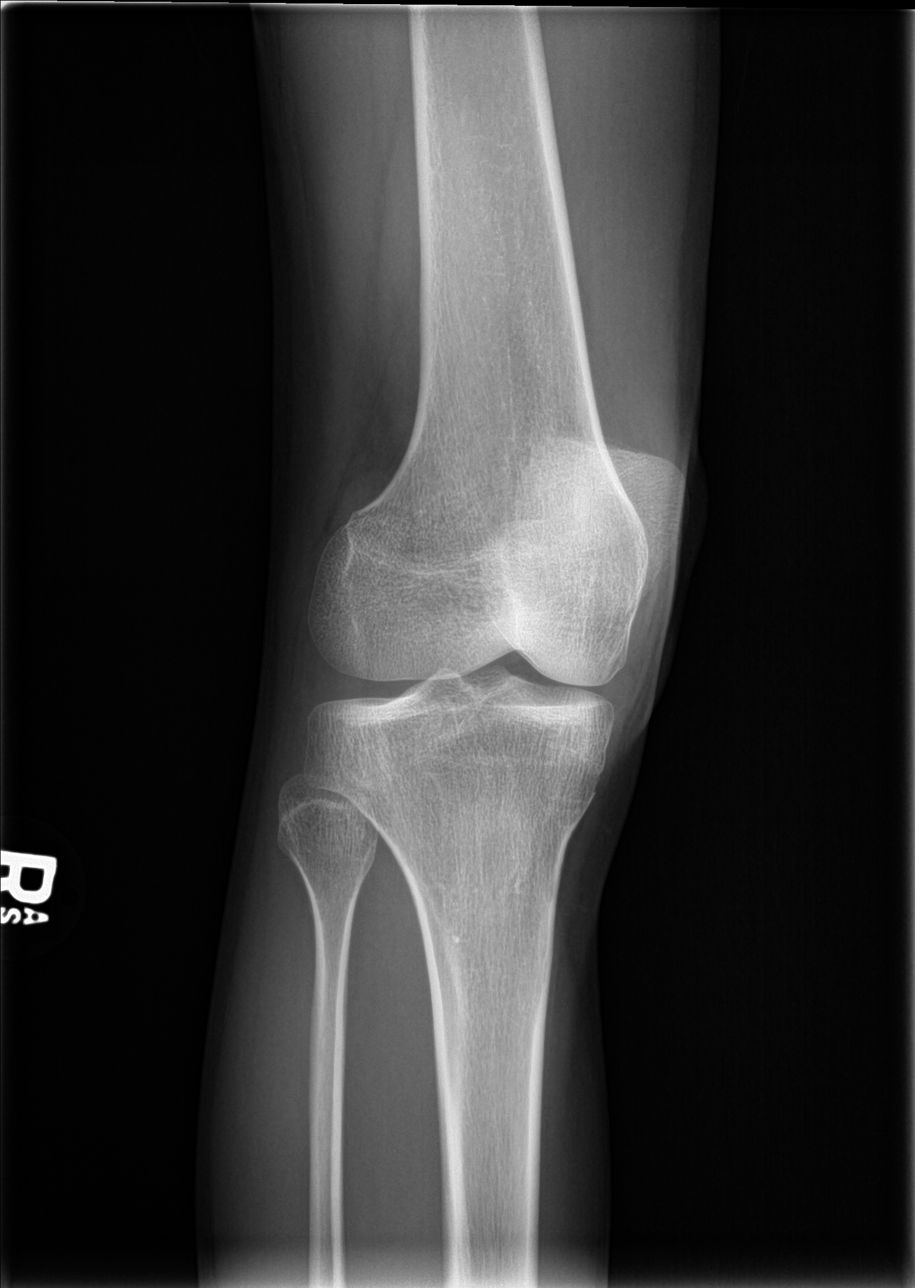

[knee obl (2 of 2)]
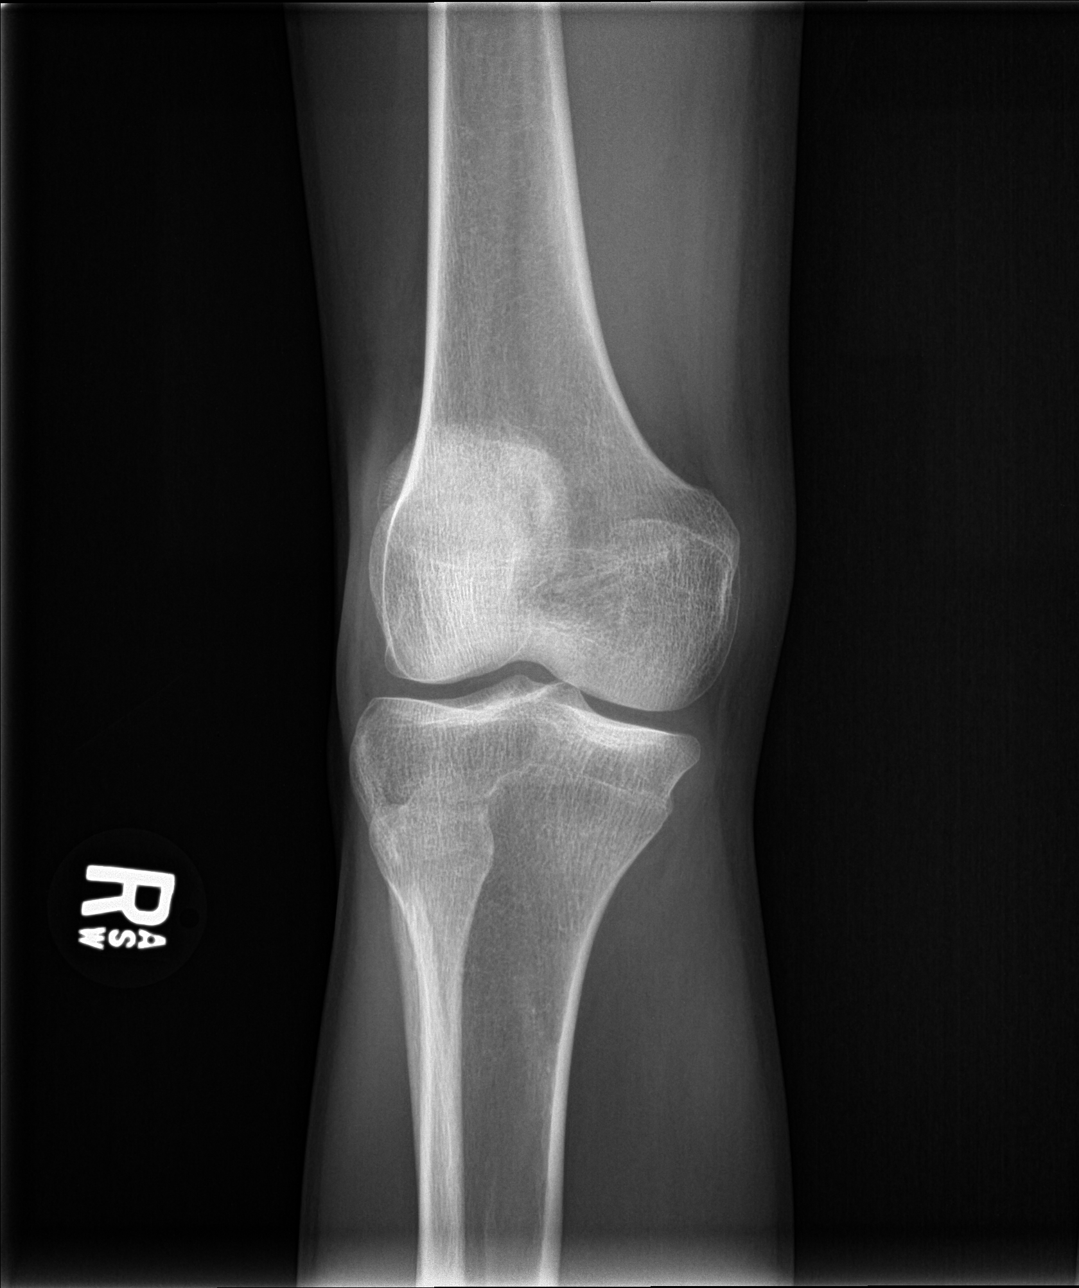

[4 of 4 positions shown; findings below may reference images not displayed]

FINDINGS: No evidence of fracture, dislocation, or joint effusion. No evidence
of arthropathy or other focal bone abnormality. Soft tissues are
unremarkable.
IMPRESSION: Negative.

## 2021-10-26 ENCOUNTER — Encounter: Payer: Self-pay | Admitting: Emergency Medicine

## 2021-10-26 ENCOUNTER — Emergency Department
Admission: EM | Admit: 2021-10-26 | Discharge: 2021-10-26 | Disposition: A | Discharge status: Home / Self Care | Payer: Medicaid Other | Attending: Emergency Medicine | Admitting: Emergency Medicine

## 2021-10-26 ENCOUNTER — Emergency Department: Payer: Medicaid Other

## 2021-10-26 DIAGNOSIS — S0101XA Laceration without foreign body of scalp, initial encounter: Secondary | ICD-10-CM | POA: Diagnosis not present

## 2021-10-26 DIAGNOSIS — Y92410 Unspecified street and highway as the place of occurrence of the external cause: Secondary | ICD-10-CM | POA: Insufficient documentation

## 2021-10-26 DIAGNOSIS — Z23 Encounter for immunization: Secondary | ICD-10-CM | POA: Insufficient documentation

## 2021-10-26 DIAGNOSIS — S060X1A Concussion with loss of consciousness of 30 minutes or less, initial encounter: Secondary | ICD-10-CM | POA: Insufficient documentation

## 2021-10-26 DIAGNOSIS — S42031A Displaced fracture of lateral end of right clavicle, initial encounter for closed fracture: Secondary | ICD-10-CM | POA: Insufficient documentation

## 2021-10-26 DIAGNOSIS — S0990XA Unspecified injury of head, initial encounter: Secondary | ICD-10-CM | POA: Diagnosis present

## 2021-10-26 DIAGNOSIS — S42001A Fracture of unspecified part of right clavicle, initial encounter for closed fracture: Secondary | ICD-10-CM

## 2021-10-26 LAB — CBC WITH DIFFERENTIAL/PLATELET
Abs Immature Granulocytes: 0.05 10*3/uL (ref 0.00–0.07)
Basophils Absolute: 0.1 10*3/uL (ref 0.0–0.1)
Basophils Relative: 1 %
Eosinophils Absolute: 0.1 10*3/uL (ref 0.0–0.5)
Eosinophils Relative: 1 %
HCT: 38.6 % (ref 36.0–46.0)
Hemoglobin: 13.2 g/dL (ref 12.0–15.0)
Immature Granulocytes: 0 %
Lymphocytes Relative: 15 %
Lymphs Abs: 2 10*3/uL (ref 0.7–4.0)
MCH: 31.4 pg (ref 26.0–34.0)
MCHC: 34.2 g/dL (ref 30.0–36.0)
MCV: 91.9 fL (ref 80.0–100.0)
Monocytes Absolute: 1.1 10*3/uL — ABNORMAL HIGH (ref 0.1–1.0)
Monocytes Relative: 8 %
Neutro Abs: 10 10*3/uL — ABNORMAL HIGH (ref 1.7–7.7)
Neutrophils Relative %: 75 %
Platelets: 229 10*3/uL (ref 150–400)
RBC: 4.2 MIL/uL (ref 3.87–5.11)
RDW: 12.9 % (ref 11.5–15.5)
WBC: 13.2 10*3/uL — ABNORMAL HIGH (ref 4.0–10.5)
nRBC: 0 % (ref 0.0–0.2)

## 2021-10-26 LAB — COMPREHENSIVE METABOLIC PANEL
ALT: 20 U/L (ref 0–44)
AST: 28 U/L (ref 15–41)
Albumin: 4.7 g/dL (ref 3.5–5.0)
Alkaline Phosphatase: 48 U/L (ref 38–126)
Anion gap: 6 (ref 5–15)
BUN: 13 mg/dL (ref 6–20)
CO2: 26 mmol/L (ref 22–32)
Calcium: 9.3 mg/dL (ref 8.9–10.3)
Chloride: 104 mmol/L (ref 98–111)
Creatinine, Ser: 0.68 mg/dL (ref 0.44–1.00)
GFR, Estimated: >60 mL/min (ref >60)
Glucose, Bld: 118 mg/dL — ABNORMAL HIGH (ref 70–99)
Potassium: 3.5 mmol/L (ref 3.5–5.1)
Sodium: 136 mmol/L (ref 135–145)
Total Bilirubin: 1.2 mg/dL (ref 0.3–1.2)
Total Protein: 8.1 g/dL (ref 6.5–8.1)

## 2021-10-26 MED ORDER — LIDOCAINE HCL (PF) 1 % IJ SOLN
INTRAMUSCULAR | Status: AC
  Filled 2021-10-26: qty 5

## 2021-10-26 MED ORDER — OXYCODONE HCL 5 MG PO TABS: 5.0000 mg | ORAL_TABLET | Freq: Three times a day (TID) | ORAL | 0 refills | Status: AC | PRN

## 2021-10-26 MED ORDER — LIDOCAINE HCL (PF) 2 % IJ SOLN
5.0000 mL | Freq: Once | INTRAMUSCULAR | Status: DC
  Filled 2021-10-26 (×2): qty 5

## 2021-10-26 MED ORDER — TETANUS-DIPHTH-ACELL PERTUSSIS 5-2.5-18.5 LF-MCG/0.5 IM SUSY
0.5000 mL | PREFILLED_SYRINGE | Freq: Once | INTRAMUSCULAR | Status: AC
  Administered 2021-10-26: 0.5 mL via INTRAMUSCULAR
  Filled 2021-10-26: qty 0.5

## 2021-10-26 MED ORDER — ONDANSETRON 4 MG PO TBDP: 4.0000 mg | ORAL_TABLET | Freq: Three times a day (TID) | ORAL | 0 refills | Status: AC | PRN

## 2021-10-26 NOTE — ED Notes (Signed)
Patient verbalizes understanding of discharge instructions. Opportunity for questioning and answers were provided. Armband removed by staff, pt discharged from ED. Wheeled out to lobby  

## 2021-10-26 NOTE — ED Notes (Signed)
Arm sling applied at this time. Pt and spouse given instructions on application of arm sling. Both verbalized understanding.
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
## 2021-10-26 NOTE — Discharge Instructions (Addendum)
Take oxycodone as prescribed. Do not drink alcohol, drive or participate in any other potentially dangerous activities while taking this medication as it may make you sleepy. Do not take this medication with any other sedating medications, either prescription or over-the-counter. If you were prescribed Percocet or Vicodin, do not take these with acetaminophen (Tylenol) as it is already contained within these medications.  This medication is an opiate (or narcotic) pain medication and can be habit forming. Use it as little as possible to achieve adequate pain control. Do not use or use it with extreme caution if you have a history of opiate abuse or dependence. If you are on a pain contract with your primary care doctor or a pain specialist, be sure to let them know you were prescribed this medication today from the Emergency Department. This medication is intended for your use only - do not give any to anyone else and keep it in a secure place where nobody else, especially children, have access to it.
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
## 2021-10-26 NOTE — ED Notes (Signed)
C-collar applied in triage, pt taken to xray and CT

## 2021-11-01 ENCOUNTER — Other Ambulatory Visit: Payer: Self-pay

## 2021-11-01 ENCOUNTER — Ambulatory Visit
Admission: RE | Admit: 2021-11-01 | Discharge: 2021-11-01 | Disposition: A | Discharge status: Home / Self Care | Payer: Medicaid Other | Source: Ambulatory Visit

## 2021-11-01 VITALS — BP 112/75 | HR 88 | Temp 98.6°F | Resp 14 | Ht 63.0 in | Wt 86.0 lb

## 2021-11-01 DIAGNOSIS — S0181XD Laceration without foreign body of other part of head, subsequent encounter: Secondary | ICD-10-CM | POA: Diagnosis not present

## 2021-11-01 DIAGNOSIS — Z4802 Encounter for removal of sutures: Secondary | ICD-10-CM | POA: Diagnosis not present
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
## 2021-11-01 NOTE — Discharge Instructions (Addendum)
Keep the wound clean and dry.  Apply sunscreen to the healed laceration to prevent the scar from pigmented and being more prominent.  Return for reevaluation for new or worsening symptoms.

## 2021-11-01 NOTE — ED Triage Notes (Signed)
Patient here for suture removal to her forehead. Patient states that her sutures were placed at [facility] ED on 10/16/21.

## 2022-04-30 ENCOUNTER — Other Ambulatory Visit: Payer: Self-pay

## 2022-04-30 ENCOUNTER — Ambulatory Visit
Admission: EM | Admit: 2022-04-30 | Discharge: 2022-04-30 | Disposition: A | Discharge status: Home / Self Care | Payer: Medicaid Other | Source: Ambulatory Visit

## 2022-04-30 ENCOUNTER — Ambulatory Visit (INDEPENDENT_AMBULATORY_CARE_PROVIDER_SITE_OTHER): Payer: Medicaid Other

## 2022-04-30 VITALS — BP 129/85 | HR 75 | Temp 98.2°F | Resp 16 | Ht 62.0 in | Wt 105.0 lb

## 2022-04-30 DIAGNOSIS — M533 Sacrococcygeal disorders, not elsewhere classified: Secondary | ICD-10-CM | POA: Diagnosis not present

## 2022-04-30 NOTE — ED Provider Notes (Signed)
?[place] ? ? ? ?CSN: [id number] ?Arrival date & time: 04/30/22  1351 ? ? ?  ? ?History   ?Chief Complaint ?Chief Complaint  ?Patient presents with  ? Tailbone Pain  ? ? ?HPI ?Meagan Butler is a 26 y.o. female presenting for coccygeal pain since yesterday.  Patient states that she was swinging on a playground swing and fell off the swing and fell directly onto her bottom.  She reports increased pain when she is walking, climbing stairs or sitting.  Has not really treated condition in any way other than icing the area.  She denies any back pain or other injuries.  No head injury or syncope. ? ?HPI ? ?Past Medical History:  ?Diagnosis Date  ? Anemia   ? Depression   ? ? ?Patient Active Problem List  ? Diagnosis Date Noted  ? BMI less than 19,adult 04/06/2017  ? Underweight 07/18/2013  ? ? ?Past Surgical History:  ?Procedure Laterality Date  ? NO PAST SURGERIES    ? ? ?OB History   ? ? Gravida  ?1  ? Para  ?1  ? Term  ?1  ? Preterm  ?   ? AB  ?   ? Living  ?1  ?  ? ? SAB  ?   ? IAB  ?   ? Ectopic  ?   ? Multiple  ?0  ? Live Births  ?1  ?   ?  ?  ? ? ? ?Home Medications   ? ?Prior to Admission medications   ?Medication Sig Start Date End Date Taking? Authorizing Provider  ?escitalopram (LEXAPRO) 10 MG tablet Take 10 mg by mouth daily. 03/25/22   [provider]  ?levonorgestrel (MIRENA) 20 MCG/24HR IUD 1 each once by Intrauterine route.    [provider]  ?mirtazapine (REMERON) 15 MG tablet Take 15 mg by mouth at bedtime. 04/29/22   [provider]  ?ondansetron (ZOFRAN ODT) 4 MG disintegrating tablet Take 1 tablet (4 mg total) by mouth every 8 (eight) hours as needed for nausea or vomiting. 10/26/21   Vanessa Peconic, MD  ? ? ?Family History ?Family History  ?Adopted: Yes  ?Problem Relation Age of Onset  ? Breast cancer Neg Hx   ? Ovarian cancer Neg Hx   ? Colon cancer Neg Hx   ? Diabetes Neg Hx   ? ? ?Social History ?Social History  ? ?Tobacco Use  ? Smoking status: Never  ?  Smokeless tobacco: Never  ?Vaping Use  ? Vaping Use: Never used  ?Substance Use Topics  ? Alcohol use: No  ? Drug use: Yes  ?  Types: Marijuana  ? ? ? ?Allergies   ?Patient has no known allergies. ? ? ?Review of Systems ?Review of Systems  ?Musculoskeletal:  Positive for arthralgias (coccyx pain). Negative for back pain, neck pain and neck stiffness.  ?Skin:  Negative for color change and wound.  ?Neurological:  Negative for syncope and weakness.  ? ? ?Physical Exam ?Triage Vital Signs ?ED Triage Vitals  ?Enc Vitals Group  ?   BP 04/30/22 1400 129/85  ?   Pulse Rate 04/30/22 1400 75  ?   Resp 04/30/22 1400 16  ?   Temp 04/30/22 1400 98.2 ?F (36.8 ?C)  ?   Temp Source 04/30/22 1400 Oral  ?   SpO2 04/30/22 1400 98 %  ?   Weight 04/30/22 1402 105 lb (47.6 kg)  ?   Height 04/30/22 1402 5\' 2"  (  1.575 m)  ?   Head Circumference --   ?   Peak Flow --   ?   Pain Score 04/30/22 1400 4  ?   Pain Loc --   ?   Pain Edu? --   ?   Excl. in [place]? --   ? ?No data found. ? ?Updated Vital Signs ?BP 129/85 (BP Location: Left Arm)   Pulse 75   Temp 98.2 ?F (36.8 ?C) (Oral)   Resp 16   Ht 5\' 2"  (1.575 m)   Wt 105 lb (47.6 kg)   SpO2 98%   BMI 19.20 kg/m?  ?   ? ?Physical Exam ?Vitals and nursing note reviewed.  ?Constitutional:   ?   General: She is not in acute distress. ?   Appearance: Normal appearance. She is not ill-appearing or toxic-appearing.  ?HENT:  ?   Head: Normocephalic and atraumatic.  ?Eyes:  ?   General: No scleral icterus.    ?   Right eye: No discharge.     ?   Left eye: No discharge.  ?   Conjunctiva/sclera: Conjunctivae normal.  ?Cardiovascular:  ?   Rate and Rhythm: Normal rate and regular rhythm.  ?   Heart sounds: Normal heart sounds.  ?Pulmonary:  ?   Effort: Pulmonary effort is normal. No respiratory distress.  ?   Breath sounds: Normal breath sounds.  ?Musculoskeletal:  ?   Cervical back: Neck supple.  ?   Comments: Tenderness to palpation of the coccyx.  No swelling or ecchymosis of sacrum or lower back.   No tenderness palpation of the lower back and full range of motion lower back.  ?Skin: ?   General: Skin is dry.  ?Neurological:  ?   General: No focal deficit present.  ?   Mental Status: She is alert. Mental status is at baseline.  ?   Motor: No weakness.  ?   Gait: Gait normal.  ?Psychiatric:     ?   Mood and Affect: Mood normal.     ?   Behavior: Behavior normal.     ?   Thought Content: Thought content normal.  ? ? ? ?UC Treatments / Results  ?Labs ?(all labs ordered are listed, but only abnormal results are displayed) ?Labs Reviewed - No data to display ? ?EKG ? ? ?Radiology ?DG Sacrum/Coccyx ? ?Result Date: 04/30/2022 ?CLINICAL DATA:  Fall onto tailbone EXAM: SACRUM AND COCCYX - 2+ VIEW COMPARISON:  None Available. FINDINGS: Lateral projection demonstrates no displaced fracture of the sacrum coccyx. AP view demonstrates normal arcuate lines of the sacrum. SI joints intact. IUD noted. IMPRESSION: No evidence of fracture. Electronically Signed   By: Suzy Bouchard M.D.   On: 04/30/2022 14:37   ? ?Procedures ?Procedures (including critical care time) ? ?Medications Ordered in UC ?Medications - No data to display ? ?Initial Impression / Assessment and Plan / UC Course  ?I have reviewed the triage vital signs and the nursing notes. ? ?Pertinent labs & imaging results that were available during my care of the patient were reviewed by me and considered in my medical decision making (see chart for details). ? ?26 year old female presenting for coccygeal pain after falling off a swing and directly onto her bottom yesterday.  An x-ray obtained today shows no evidence of fracture.  I discussed this with her.  Advised her that she likely bruised her coccyx.  Supportive care advised with over-the-counter ibuprofen/Aleve and Tylenol, cryotherapy and advised her to get a  donut cushion.  Reviewed return and ER precautions.  Work note provided. ? ? ?Final Clinical Impressions(s) / UC Diagnoses  ? ?Final diagnoses:   ?Coccydynia  ? ? ? ?Discharge Instructions   ? ?  ?-No fractures. ?- Take ibuprofen or Aleve and Tylenol as needed for pain relief. ?- Apply ice to the area multiple times over the day to help with pain and swelling. ?- We also discussed getting a donut cushion or neck pillow to sit on. ?- You should be feeling better over the next 7 to 10 days. ? ? ? ? ?ED Prescriptions   ?None ?  ? ?PDMP not reviewed this encounter. ?  ?Danton Clap, PA-C ?04/30/22 1531 ? ?

## 2022-04-30 NOTE — ED Triage Notes (Signed)
Pt states she slipped out of her son's swing yesterday and has tailbone pain.  ?

## 2022-04-30 NOTE — Discharge Instructions (Signed)
-  No fractures. ?- Take ibuprofen or Aleve and Tylenol as needed for pain relief. ?- Apply ice to the area multiple times over the day to help with pain and swelling. ?- We also discussed getting a donut cushion or neck pillow to sit on. ?- You should be feeling better over the next 7 to 10 days. ?

## 2022-09-15 ENCOUNTER — Encounter: Payer: Self-pay | Admitting: Emergency Medicine

## 2022-09-15 ENCOUNTER — Ambulatory Visit
Admission: EM | Admit: 2022-09-15 | Discharge: 2022-09-15 | Disposition: A | Discharge status: Home / Self Care | Payer: Medicaid Other | Attending: Emergency Medicine | Admitting: Emergency Medicine

## 2022-09-15 DIAGNOSIS — S161XXA Strain of muscle, fascia and tendon at neck level, initial encounter: Secondary | ICD-10-CM | POA: Diagnosis not present

## 2022-09-15 MED ORDER — IBUPROFEN 600 MG PO TABS
600.0000 mg | ORAL_TABLET | Freq: Four times a day (QID) | ORAL | 0 refills | Status: AC | PRN
Start: 2022-09-15 — End: ?

## 2022-09-15 MED ORDER — BACLOFEN 10 MG PO TABS: 10.0000 mg | ORAL_TABLET | Freq: Three times a day (TID) | ORAL | 0 refills | Status: DC

## 2022-09-15 NOTE — ED Provider Notes (Signed)
[facility]    CSN: [id number] Arrival date & time: 09/15/22  5102      History   Chief Complaint Chief Complaint  Patient presents with   Neck Pain    HPI Meagan Butler is a 26 y.o. female.   HPI  26 year old female here for evaluation of neck pain.  Patient reports that she was getting dressed this morning and had a towel over her wet hair when she was trying to put on her shirt.  The shirt became caught and she felt and heard a pop in the right side of her neck and now is having pain in the right side of her neck that goes down into her right shoulder.  She states that she has had something similar in the past and she thought was related to musculoskeletal issues.  She denies any numbness, tingling, or weakness in her extremities.  Past Medical History:  Diagnosis Date   Anemia    Depression     Patient Active Problem List   Diagnosis Date Noted   BMI less than 19,adult 04/06/2017   Underweight 07/18/2013    Past Surgical History:  Procedure Laterality Date   NO PAST SURGERIES      OB History     Gravida  1   Para  1   Term  1   Preterm      AB      Living  1      SAB      IAB      Ectopic      Multiple  0   Live Births  1            Home Medications    Prior to Admission medications   Medication Sig Start Date End Date Taking? Authorizing Provider  baclofen (LIORESAL) 10 MG tablet Take 1 tablet (10 mg total) by mouth 3 (three) times daily. 09/15/22  Yes Margarette Canada, NP  escitalopram (LEXAPRO) 10 MG tablet Take 10 mg by mouth daily. 03/25/22  Yes [provider]  ibuprofen (ADVIL) 600 MG tablet Take 1 tablet (600 mg total) by mouth every 6 (six) hours as needed. 09/15/22  Yes Margarette Canada, NP  levonorgestrel (MIRENA) 20 MCG/24HR IUD 1 each once by Intrauterine route.   Yes [provider]  mirtazapine (REMERON) 15 MG tablet Take 15 mg by mouth at bedtime. 04/29/22  Yes [provider]   ondansetron (ZOFRAN ODT) 4 MG disintegrating tablet Take 1 tablet (4 mg total) by mouth every 8 (eight) hours as needed for nausea or vomiting. 10/26/21   Vanessa [place], MD    Family History Family History  Adopted: Yes  Problem Relation Age of Onset   Breast cancer Neg Hx    Ovarian cancer Neg Hx    Colon cancer Neg Hx    Diabetes Neg Hx     Social History Social History   Tobacco Use   Smoking status: Never   Smokeless tobacco: Never  Vaping Use   Vaping Use: Never used  Substance Use Topics   Alcohol use: No   Drug use: Yes    Types: Marijuana     Allergies   Patient has no known allergies.   Review of Systems Review of Systems  Musculoskeletal:  Positive for neck pain. Negative for neck stiffness.  Neurological:  Negative for weakness and numbness.  Hematological: Negative.   Psychiatric/Behavioral: Negative.       Physical Exam Triage Vital Signs  ED Triage Vitals  Enc Vitals Group     BP --      Pulse --      Resp --      Temp --      Temp src --      SpO2 --      Weight 09/15/22 0934 104 lb 15 oz (47.6 kg)     Height 09/15/22 0934 5\' 2"  (1.575 m)     Head Circumference --      Peak Flow --      Pain Score 09/15/22 0933 7     Pain Loc --      Pain Edu? --      Excl. in [place]? --    No data found.  Updated Vital Signs BP 112/76 (BP Location: Right Arm)   Pulse 70   Temp 98.6 F (37 C) (Oral)   Resp 16   Ht 5\' 2"  (1.575 m)   Wt 104 lb 15 oz (47.6 kg)   SpO2 96%   BMI 19.19 kg/m   Visual Acuity Right Eye Distance:   Left Eye Distance:   Bilateral Distance:    Right Eye Near:   Left Eye Near:    Bilateral Near:     Physical Exam Vitals and nursing note reviewed.  Constitutional:      Appearance: Normal appearance. She is not ill-appearing.  HENT:     Head: Normocephalic and atraumatic.  Musculoskeletal:        General: Tenderness present. No swelling, deformity or signs of injury. Normal range of motion.     Comments: Patient  has no tenderness or step-off with palpation of the spinous processes of the cervical spine or upper thoracic spine.  She does have very mild superior right-sided paracervical tenderness in the muscle body and some mild spasm and tension.  Patient's bilateral grips are 5/5 and her upper extremity strength is 5/5.  Skin:    General: Skin is warm and dry.     Capillary Refill: Capillary refill takes less than 2 seconds.     Findings: No bruising or erythema.  Neurological:     General: No focal deficit present.     Mental Status: She is alert and oriented to person, place, and time.     Sensory: No sensory deficit.     Motor: No weakness.  Psychiatric:        Mood and Affect: Mood normal.        Behavior: Behavior normal.        Thought Content: Thought content normal.        Judgment: Judgment normal.      UC Treatments / Results  Labs (all labs ordered are listed, but only abnormal results are displayed) Labs Reviewed - No data to display  EKG   Radiology No results found.  Procedures Procedures (including critical care time)  Medications Ordered in UC Medications - No data to display  Initial Impression / Assessment and Plan / UC Course  I have reviewed the triage vital signs and the nursing notes.  Pertinent labs & imaging results that were available during my care of the patient were reviewed by me and considered in my medical decision making (see chart for details).   Patient is nontoxic-appearing 26 year old female here for evaluation of right-sided neck pain that occurred after she popped her neck while putting her shirt on over her t towel covered head.  She is moving all extremities equally and both of her  grips are strong and equal.  Her upper extremity strength is strong and equal.  There is no tenderness or step-off with palpation of the spinous processes of the cervical spine.  She does have full range of motion of her neck but states when she gets into range of  motion with right lateral rotation it causes pain as well as with flexion of the neck tucking her chin to her chest.  Patient's exam is consistent with cervical strain.  I suspect that the Pap was either patient moved fluid around in her tissues are [place] and nitrogen deposit.  I will treat her for cervical strain with ibuprofen, baclofen, moist heat, and home physical therapy.  I have given her work note to be out of work for the next 2 days.  Return precautions reviewed.   Final Clinical Impressions(s) / UC Diagnoses   Final diagnoses:  Acute strain of neck muscle, initial encounter     Discharge Instructions      Take the ibuprofen, 600 mg every 6 hours with food, on a schedule for the next 48 hours and then as needed.  Take the Baclofen, 10 mg every 8 hours, on a schedule for the next 48 hours and then as needed.  Apply moist heat to your neck for 30 minutes at a time 2-3 times a day to improve blood flow to the area and help remove the lactic acid causing the spasm.  Follow the neck exercises given at discharge.  Return for reevaluation for any new or worsening symptoms.      ED Prescriptions     Medication Sig Dispense Auth. Provider   ibuprofen (ADVIL) 600 MG tablet Take 1 tablet (600 mg total) by mouth every 6 (six) hours as needed. 30 tablet Becky Augusta, NP   baclofen (LIORESAL) 10 MG tablet Take 1 tablet (10 mg total) by mouth 3 (three) times daily. 30 each Becky Augusta, NP      PDMP not reviewed this encounter.   Becky Augusta, NP 09/15/22 [phone]

## 2022-09-15 NOTE — ED Triage Notes (Signed)
Pt c/o right sided neck pain. Started this morning. She states she had a towel on her head and heard a pop. She states the pain radiates into her right shoulder.

## 2022-09-15 NOTE — Discharge Instructions (Signed)
Take the ibuprofen, 600 mg every 6 hours with food, on a schedule for the next 48 hours and then as needed.  Take the Baclofen, 10 mg every 8 hours, on a schedule for the next 48 hours and then as needed.  Apply moist heat to your neck for 30 minutes at a time 2-3 times a day to improve blood flow to the area and help remove the lactic acid causing the spasm.  Follow the neck exercises given at discharge.  Return for reevaluation for any new or worsening symptoms.

## 2022-11-08 IMAGING — CR DG SACRUM/COCCYX 2+V
3 series · 3 of 3 positions shown · non-contrast
Comparison: None Available.

CLINICAL DATA: Fall onto tailbone

EXAM:
SACRUM AND COCCYX - 2+ VIEW

[coccyx ap]
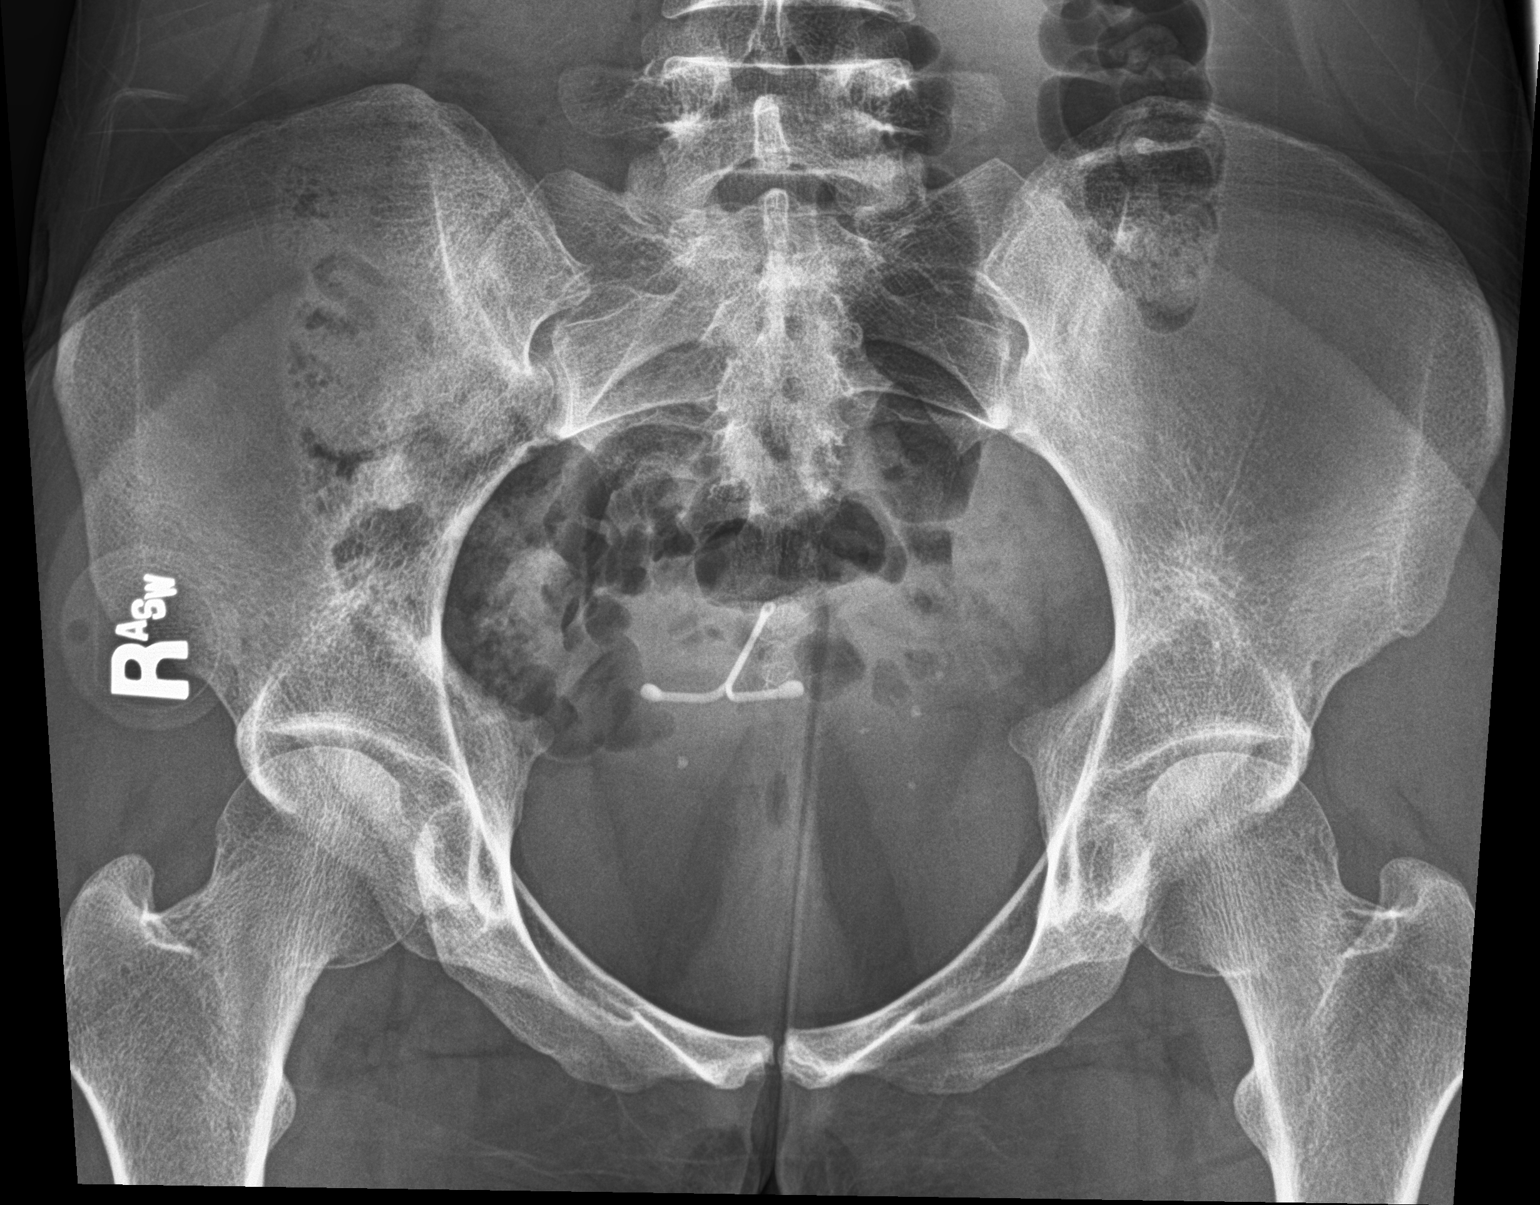

[sacrum ap]
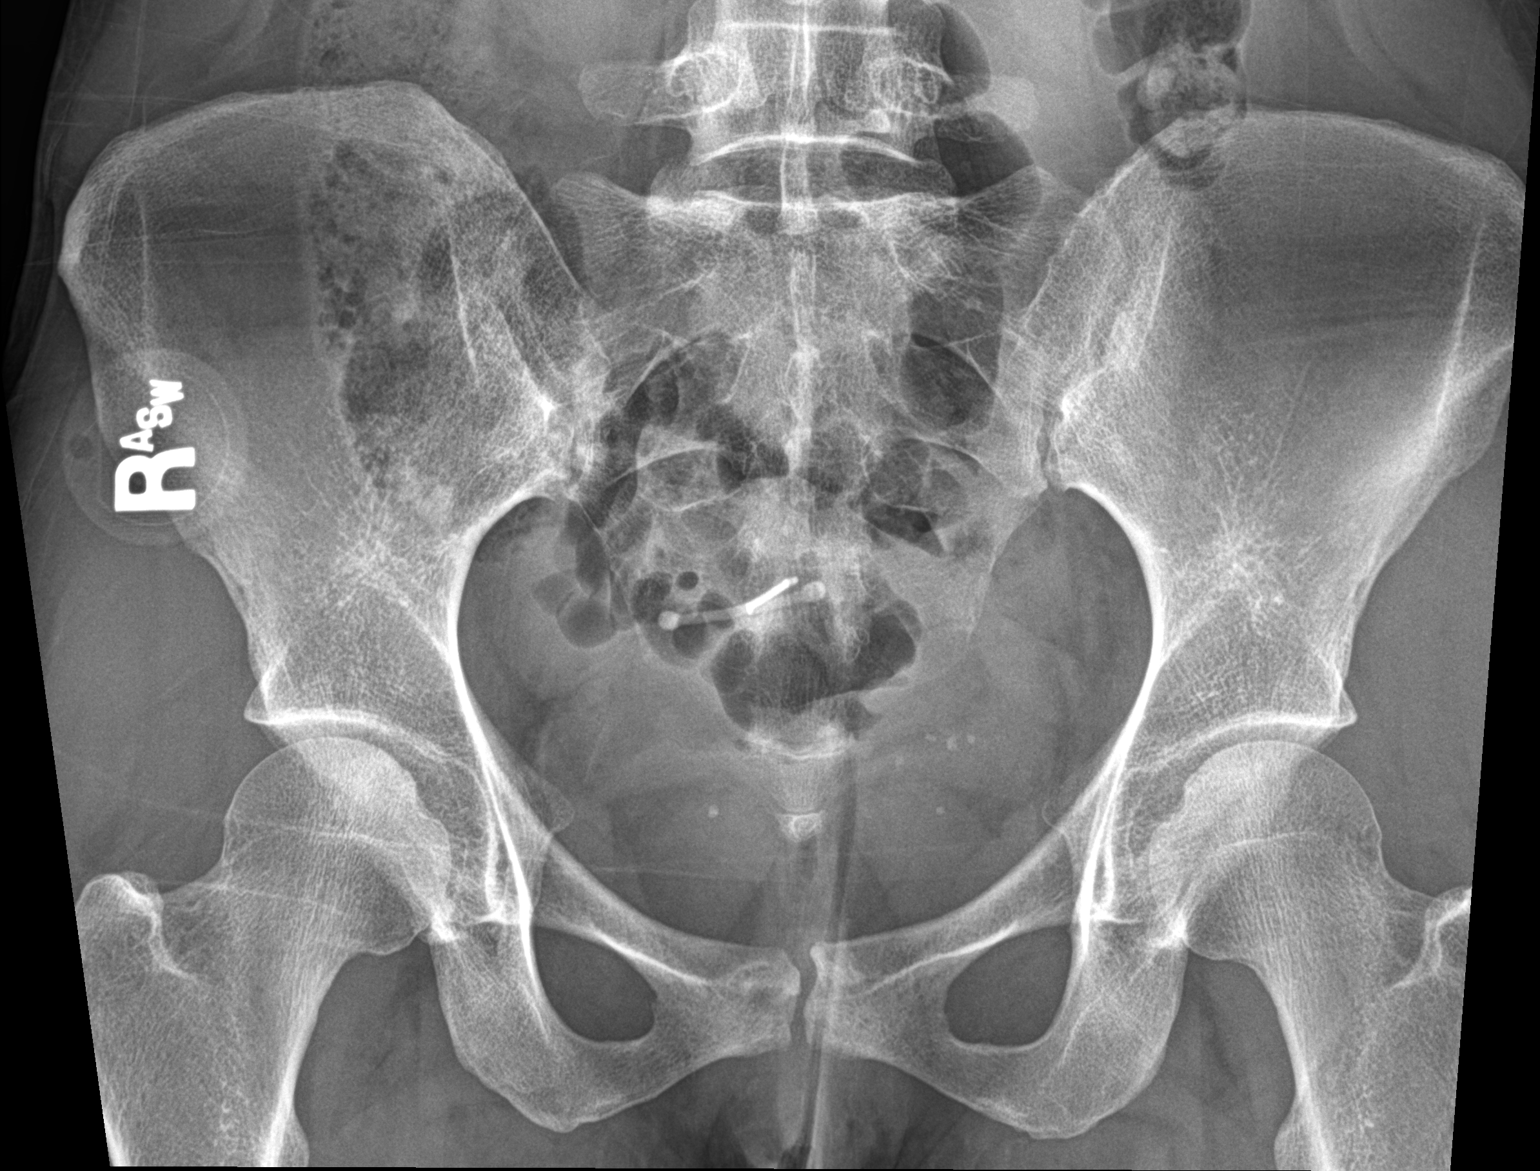

[sacrum lat]
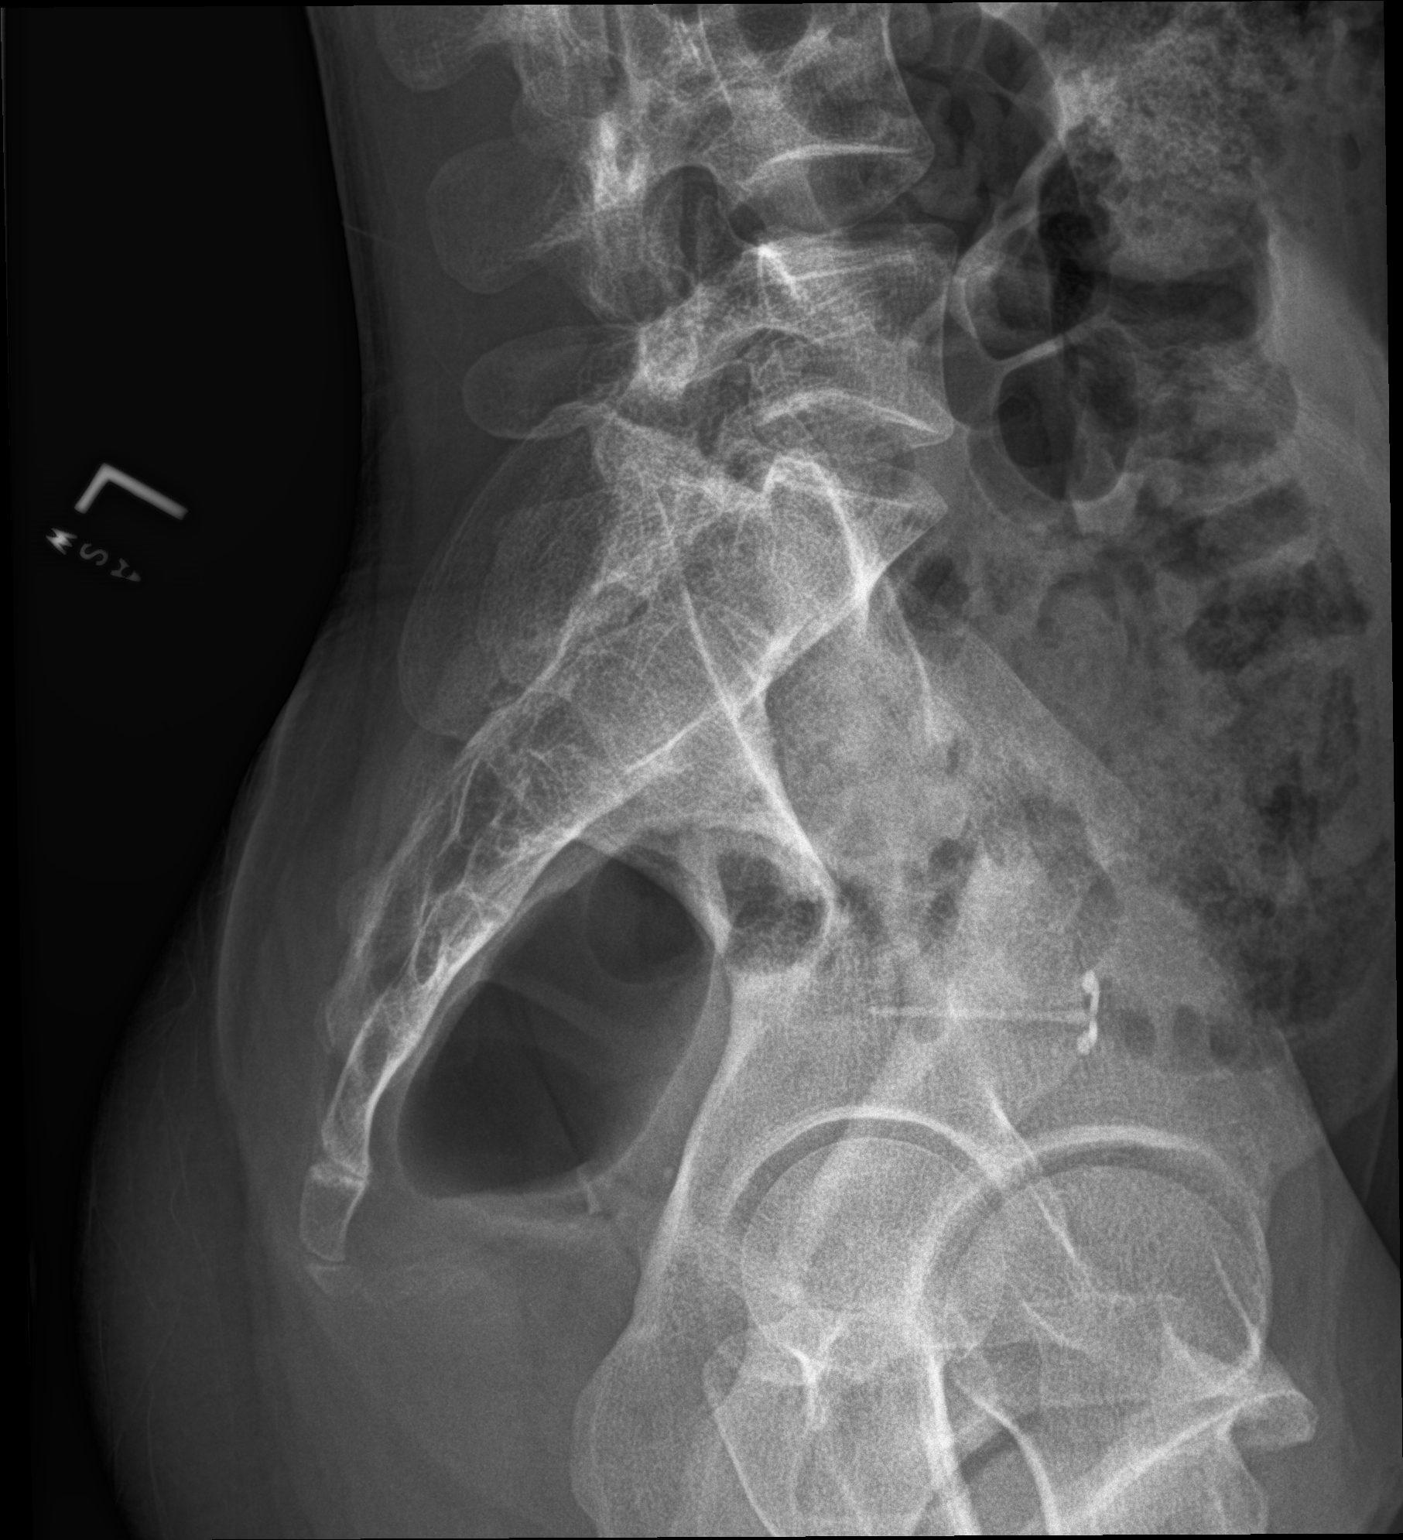

[3 of 3 positions shown; findings below may reference images not displayed]

FINDINGS: Lateral projection demonstrates no displaced fracture of the sacrum
coccyx.

AP view demonstrates normal arcuate lines of the sacrum. SI joints
intact. IUD noted.
IMPRESSION: No evidence of fracture.

## 2023-11-03 ENCOUNTER — Ambulatory Visit
Admission: RE | Admit: 2023-11-03 | Discharge: 2023-11-03 | Disposition: A | Discharge status: Home / Self Care | Payer: Medicaid Other | Source: Ambulatory Visit | Attending: Emergency Medicine | Admitting: Emergency Medicine

## 2023-11-03 ENCOUNTER — Ambulatory Visit: Payer: Medicaid Other

## 2023-11-03 VITALS — BP 105/70 | HR 70 | Temp 98.2°F | Resp 16

## 2023-11-03 DIAGNOSIS — M25571 Pain in right ankle and joints of right foot: Secondary | ICD-10-CM | POA: Diagnosis not present

## 2023-11-03 DIAGNOSIS — S93401A Sprain of unspecified ligament of right ankle, initial encounter: Secondary | ICD-10-CM

## 2023-11-03 NOTE — Discharge Instructions (Addendum)
Please check my chart for officail radiologsit read, your wet read is negative(unofficial). Rest,ice,elevate,wear ace wrap for comfort. May alternate tylenol/ibuprofen as label directed for pain. Follow up with orthopedics-call for appt if symptoms persist.

## 2023-11-03 NOTE — ED Triage Notes (Signed)
Pt presents with right ankle pain x 1 week. She rolled her ankle and had it wrapped. She needs a note in order to return to work.

## 2023-11-03 NOTE — ED Provider Notes (Signed)
[facility]    CSN: [id number] Arrival date & time: 11/03/23  1115      History   Chief Complaint Chief Complaint  Patient presents with   Ankle Pain    Entered by patient    HPI Meagan Butler is a 27 y.o. female.   27 year old female, Meagan Butler, presents to urgent care for evaluation of right ankle pain after rolling ankle a week ago.  Patient states she was sent here from her work to get evaluated as they cut her hours today and needs a note to return to work. Pt has been using ace wrap. No prior injury.  The history is provided by the patient. No language interpreter was used.    Past Medical History:  Diagnosis Date   Anemia    Depression     Patient Active Problem List   Diagnosis Date Noted   Acute right ankle pain 11/03/2023   Sprain of right ankle 11/03/2023   BMI less than 19,adult 04/06/2017   Underweight 07/18/2013    Past Surgical History:  Procedure Laterality Date   NO PAST SURGERIES      OB History     Gravida  1   Para  1   Term  1   Preterm      AB      Living  1      SAB      IAB      Ectopic      Multiple  0   Live Births  1            Home Medications    Prior to Admission medications   Medication Sig Start Date End Date Taking? Authorizing Provider  escitalopram (LEXAPRO) 10 MG tablet Take 10 mg by mouth daily. 03/25/22  Yes [provider]  levonorgestrel (MIRENA) 20 MCG/24HR IUD 1 each once by Intrauterine route.   Yes [provider]  mirtazapine (REMERON) 15 MG tablet Take 15 mg by mouth at bedtime. 04/29/22  Yes [provider]  baclofen (LIORESAL) 10 MG tablet Take 1 tablet (10 mg total) by mouth 3 (three) times daily. 09/15/22   Becky Augusta, NP  ibuprofen (ADVIL) 600 MG tablet Take 1 tablet (600 mg total) by mouth every 6 (six) hours as needed. 09/15/22   Becky Augusta, NP  ondansetron (ZOFRAN ODT) 4 MG disintegrating tablet Take 1 tablet (4 mg total) by mouth every  8 (eight) hours as needed for nausea or vomiting. 10/26/21   Concha Se, MD    Family History Family History  Adopted: Yes  Problem Relation Age of Onset   Breast cancer Neg Hx    Ovarian cancer Neg Hx    Colon cancer Neg Hx    Diabetes Neg Hx     Social History Social History   Tobacco Use   Smoking status: Never   Smokeless tobacco: Never  Vaping Use   Vaping status: Never Used  Substance Use Topics   Alcohol use: No   Drug use: Yes    Types: Marijuana     Allergies   Patient has no known allergies.   Review of Systems Review of Systems  Musculoskeletal:  Positive for arthralgias, gait problem and joint swelling.  Skin:  Positive for color change.  All other systems reviewed and are negative.    Physical Exam Triage Vital Signs ED Triage Vitals  Encounter Vitals Group     BP 11/03/23 1140 105/70  Systolic BP Percentile --      Diastolic BP Percentile --      Pulse Rate 11/03/23 1140 70     Resp 11/03/23 1140 16     Temp 11/03/23 1140 98.2 F (36.8 C)     Temp Source 11/03/23 1140 Oral     SpO2 11/03/23 1140 98 %     Weight --      Height --      Head Circumference --      Peak Flow --      Pain Score 11/03/23 1138 2     Pain Loc --      Pain Education --      Exclude from Growth Chart --    No data found.  Updated Vital Signs BP 105/70 (BP Location: Right Arm)   Pulse 70   Temp 98.2 F (36.8 C) (Oral)   Resp 16   LMP 11/03/2023   SpO2 98%   Visual Acuity Right Eye Distance:   Left Eye Distance:   Bilateral Distance:    Right Eye Near:   Left Eye Near:    Bilateral Near:     Physical Exam Vitals and nursing note reviewed.  Musculoskeletal:     Right ankle: Swelling and ecchymosis present. Tenderness present over the lateral malleolus and posterior TF ligament. Normal pulse.       Legs:  Neurological:     General: No focal deficit present.     Mental Status: She is alert and oriented to person, place, and time.     GCS:  GCS eye subscore is 4. GCS verbal subscore is 5. GCS motor subscore is 6.     Cranial Nerves: No cranial nerve deficit.     Sensory: No sensory deficit.  Psychiatric:        Attention and Perception: Attention normal.        Mood and Affect: Mood normal.        Speech: Speech normal.        Behavior: Behavior normal.      UC Treatments / Results  Labs (all labs ordered are listed, but only abnormal results are displayed) Labs Reviewed - No data to display  EKG   Radiology DG Ankle Complete Right  Result Date: 11/03/2023 CLINICAL DATA:  Right ankle pain after rolling ankle on Saturday. EXAM: RIGHT ANKLE - COMPLETE 3+ VIEW COMPARISON:  None Available. FINDINGS: There is no evidence of fracture or dislocation. The ankle mortise is preserved. There is no evidence of arthropathy or other focal bone abnormality. There is a small ankle joint effusion. Mild lateral soft tissue edema. IMPRESSION: Mild lateral soft tissue edema and small ankle joint effusion. No fracture or dislocation. Electronically Signed   By: Narda Rutherford M.D.   On: 11/03/2023 16:29    Procedures Procedures (including critical care time)  Medications Ordered in UC Medications - No data to display  Initial Impression / Assessment and Plan / UC Course  I have reviewed the triage vital signs and the nursing notes.  Pertinent labs & imaging results that were available during my care of the patient were reviewed by me and considered in my medical decision making (see chart for details).  Clinical Course as of 11/03/23 1753  Tue Nov 03, 2023  1256 Wet read of right ankle is negative, no fracture or dislocation noted.  [JD]    Clinical Course User Index [JD] Damia Bobrowski, Para [place], NP   Discussed exam findings and plan of care  with patient, strict go to ER precautions given.   Patient verbalized understanding to this provider.  Ddx: Acute right ankle pain, right ankle sprain, musculoskeletal pain Final Clinical  Impressions(s) / UC Diagnoses   Final diagnoses:  Acute right ankle pain  Sprain of right ankle, unspecified ligament, initial encounter     Discharge Instructions      Please check my chart for officail radiologsit read, your wet read is negative(unofficial). Rest,ice,elevate,wear ace wrap for comfort. May alternate tylenol/ibuprofen as label directed for pain. Follow up with orthopedics-call for appt if symptoms persist.     ED Prescriptions   None    PDMP not reviewed this encounter.   Clancy Gourd, NP 11/03/23 1753

## 2024-11-09 ENCOUNTER — Ambulatory Visit (INDEPENDENT_AMBULATORY_CARE_PROVIDER_SITE_OTHER)

## 2024-11-09 ENCOUNTER — Ambulatory Visit: Payer: Self-pay | Admitting: Family Medicine

## 2024-11-09 ENCOUNTER — Ambulatory Visit
Admission: EM | Admit: 2024-11-09 | Discharge: 2024-11-09 | Disposition: A | Discharge status: Home / Self Care | Attending: Family Medicine | Admitting: Family Medicine

## 2024-11-09 ENCOUNTER — Encounter: Payer: Self-pay | Admitting: Emergency Medicine

## 2024-11-09 DIAGNOSIS — R071 Chest pain on breathing: Secondary | ICD-10-CM

## 2024-11-09 MED ORDER — METHOCARBAMOL 500 MG PO TABS: 500.0000 mg | ORAL_TABLET | Freq: Two times a day (BID) | ORAL | 0 refills | Status: AC

## 2024-11-09 NOTE — ED Triage Notes (Signed)
 Pt presents with left side sharp chest pain that started today. Pt states it hurts when she take a deep breath in. She denies any other symptoms. She has not taken anything for the pain.

## 2024-11-09 NOTE — Discharge Instructions (Addendum)
 Your chest xray did not show evidence of pneumonia, collapsed lung or fluid though the radiologist has not yet read it. If they find something that I didn't, I will call you.    Go to ED for red flag symptoms, including; worsening chest pain that is accompanied by nausea, vomiting, sweating, pain in your neck or jaw. Severe headaches, vision changes, numbness/weakness in part of the body, lethargy, confusion, terrible vomiting, severe dehydration, breathing difficulty, severe persistent abdominal, feeling faint or passing out, dizziness, etc. You should especially go to the ED for sudden acute worsening of condition if you do not elect to go at this time.

## 2024-11-09 NOTE — ED Provider Notes (Signed)
 [facility]    CSN: [id number] Arrival date & time: 11/09/24  1252      History   Chief Complaint Chief Complaint  Patient presents with   Chest Pain    HPI Meagan Butler is a 28 y.o. female.   HPI  Meagan Butler here for left chest pain that is worse with deep breathing that started today while at work. She had to leave work around [employer].  She went home and laid down.  Pain described as stabbing and rated 7/10 when pain occurs but currently 4/10. Pain radiates under her breast.   No medications for pain prior to arrival. No previous similar sx.  No trauma, falls or heavy lifting.  She doesn't have a heart doctor nor does aspirin on a daily basis.    Associated Symptoms: Shortness of breath and Pleuritic pain  Patient Denies: Nausea, Vomiting, Diaphoresis, Leg swelling, Syncope, Heartburn/food sticking, Recent immobility, Wheezing, Hemoptysis, Fever, and Cough  Pt reports no history of PE, DVT, DM, HTN, cancer, recent surgery or recent travel.   Tobacco use : denies vaping and smoking      Past Medical History:  Diagnosis Date   Anemia    Depression     Patient Active Problem List   Diagnosis Date Noted   Acute right ankle pain 11/03/2023   Sprain of right ankle 11/03/2023   BMI less than 19,adult 04/06/2017   Underweight 07/18/2013    Past Surgical History:  Procedure Laterality Date   NO PAST SURGERIES      OB History     Gravida  1   Para  1   Term  1   Preterm      AB      Living  1      SAB      IAB      Ectopic      Multiple  0   Live Births  1            Home Medications    Prior to Admission medications   Medication Sig Start Date End Date Taking? Authorizing Provider  escitalopram (LEXAPRO) 10 MG tablet Take 10 mg by mouth daily. 03/25/22  Yes [provider]  levonorgestrel  (MIRENA ) 20 MCG/24HR IUD 1 each once by Intrauterine route.   Yes [provider]  methocarbamol  (ROBAXIN ) 500 MG tablet  Take 1 tablet (500 mg total) by mouth 2 (two) times daily. 11/09/24  Yes Nioka Thorington, DO  mirtazapine (REMERON) 15 MG tablet Take 15 mg by mouth at bedtime. 04/29/22  Yes [provider]  ibuprofen  (ADVIL ) 600 MG tablet Take 1 tablet (600 mg total) by mouth every 6 (six) hours as needed. 09/15/22   Bernardino Ditch, NP  ondansetron  (ZOFRAN  ODT) 4 MG disintegrating tablet Take 1 tablet (4 mg total) by mouth every 8 (eight) hours as needed for nausea or vomiting. 10/26/21   Ernest Ronal BRAVO, MD    Family History Family History  Adopted: Yes  Problem Relation Age of Onset   Breast cancer Neg Hx    Ovarian cancer Neg Hx    Colon cancer Neg Hx    Diabetes Neg Hx     Social History Social History   Tobacco Use   Smoking status: Never   Smokeless tobacco: Never  Vaping Use   Vaping status: Never Used  Substance Use Topics   Alcohol use: Yes   Drug use: Yes    Types: Marijuana  Allergies   Patient has no known allergies.   Review of Systems Review of Systems :negative unless otherwise stated in HPI.      Physical Exam Triage Vital Signs ED Triage Vitals  Encounter Vitals Group     BP 11/09/24 1320 117/75     Girls Systolic BP Percentile --      Girls Diastolic BP Percentile --      Boys Systolic BP Percentile --      Boys Diastolic BP Percentile --      Pulse Rate 11/09/24 1320 69     Resp 11/09/24 1320 17     Temp 11/09/24 1320 98.4 F (36.9 C)     Temp Source 11/09/24 1320 Oral     SpO2 11/09/24 1320 100 %     Weight 11/09/24 1316 103 lb (46.7 kg)     Height --      Head Circumference --      Peak Flow --      Pain Score 11/09/24 1318 4     Pain Loc --      Pain Education --      Exclude from Growth Chart --    No data found.  Updated Vital Signs BP 117/75 (BP Location: Left Arm)   Pulse 69   Temp 98.4 F (36.9 C) (Oral)   Resp 17   Wt 46.7 kg   LMP  (LMP Unknown)   SpO2 100%   BMI 18.84 kg/m   Visual Acuity Right Eye Distance:   Left  Eye Distance:   Bilateral Distance:    Right Eye Near:   Left Eye Near:    Bilateral Near:     Physical Exam  GEN: non-toxic appearing female, in no acute distress CV: regular rate and rhythm,  no murmurs, rubs or gallops  appreciated,, no JVP  CHEST WALL: non-tender to palpation, no step offs  RESP: no increased work of breathing, clear to ascultation bilaterally MSK: no edema, or calf tenderness SKIN: warm, dry, no rash on visible skin NEURO: alert, moves all extremities appropriately PSYCH: Normal affect, appropriate speech and behavior   UC Treatments / Results  Labs (all labs ordered are listed, but only abnormal results are displayed) Labs Reviewed - No data to display  EKG  See my EKG interpretation in the MDM section  Radiology DG Chest 2 View Result Date: 11/09/2024 CLINICAL DATA:  Meagan Butler left-sided chest pain for 1 day, short of breath EXAM: CHEST - 2 VIEW COMPARISON:  11/14/2017 FINDINGS: The heart size and mediastinal contours are within normal limits. Both lungs are clear. The visualized skeletal structures are unremarkable. IMPRESSION: No active cardiopulmonary disease. Electronically Signed   By: Ozell Daring M.D.   On: 11/09/2024 15:23      Procedures Procedures (including critical care time)  Medications Ordered in UC Medications - No data to display  Initial Impression / Assessment and Plan / UC Course  I have reviewed the triage vital signs and the nursing notes.  Pertinent labs & imaging results that were available during my care of the patient were reviewed by me and considered in my medical decision making (see chart for details).       Patient is a 28 y.o. female   who presents with 2 days of pleuritic chest pain.  VSS and she is afebrile while satting well on room air.   Differential diagnosis includes, but is not limited to, ACS, aortic dissection, pulmonary embolism, cardiac tamponade, pneumothorax, pneumonia, pericarditis/myocarditis,  GI-related causes including esophagitis/gastritis, and musculoskeletal chest wall pain   Meagan Butler has no history of PE.  EKG obtained and is without ST elevations or concern for ischemia, NSR., compared to 10/26/21 anterior changes have resolved.     Chest x-ray did not show bacterial pneumonia, pleural effusions or pneumothorax. Initial hsTrop was negative.  No GERD symptoms. Not likely illicit drug induced. No other anxiety signs or symptoms. Exam not concerning for costochondritis. Will trial muscle relaxer as I suspect MSK related pain.    Discussed MDM, treatment plan and plan for follow-up with patient who agrees with plan.        Final Clinical Impressions(s) / UC Diagnoses   Final diagnoses:  Chest pain made worse by breathing     Discharge Instructions      Your chest xray did not show evidence of pneumonia, collapsed lung or fluid though the radiologist has not yet read it. If they find something that I didn't, I will call you.    Go to ED for red flag symptoms, including; worsening chest pain that is accompanied by nausea, vomiting, sweating, pain in your neck or jaw. Severe headaches, vision changes, numbness/weakness in part of the body, lethargy, confusion, terrible vomiting, severe dehydration, breathing difficulty, severe persistent abdominal, feeling faint or passing out, dizziness, etc. You should especially go to the ED for sudden acute worsening of condition if you do not elect to go at this time.       ED Prescriptions     Medication Sig Dispense Auth. Provider   methocarbamol  (ROBAXIN ) 500 MG tablet Take 1 tablet (500 mg total) by mouth 2 (two) times daily. 30 tablet Latarshia Jersey, DO      PDMP not reviewed this encounter.   Shanaye Rief, DO 11/19/24 1301
